# Patient Record
Sex: Female | Born: 1950
Health system: Southern US, Community
[De-identification: ages and names within clinical notes are randomized; demographics above are authoritative.]

## PROBLEM LIST (undated history)

## (undated) DIAGNOSIS — N200 Calculus of kidney: Secondary | ICD-10-CM

## (undated) HISTORY — PX: NO PAST SURGERIES: SHX2092

## (undated) HISTORY — DX: Calculus of kidney: N20.0

---

## 2013-01-25 ENCOUNTER — Ambulatory Visit: Payer: Self-pay | Admitting: Family Medicine

## 2013-04-06 ENCOUNTER — Ambulatory Visit: Payer: Self-pay | Admitting: Unknown Physician Specialty

## 2017-04-12 ENCOUNTER — Encounter: Payer: Self-pay | Admitting: Nurse Practitioner

## 2017-04-12 ENCOUNTER — Telehealth: Payer: Self-pay | Admitting: *Deleted

## 2017-04-12 ENCOUNTER — Ambulatory Visit (INDEPENDENT_AMBULATORY_CARE_PROVIDER_SITE_OTHER): Payer: Medicare Other | Admitting: Nurse Practitioner

## 2017-04-12 VITALS — BP 117/62 | HR 77 | Temp 98.3°F | Ht <= 58 in | Wt 121.8 lb

## 2017-04-12 DIAGNOSIS — K219 Gastro-esophageal reflux disease without esophagitis: Secondary | ICD-10-CM

## 2017-04-12 DIAGNOSIS — Z7689 Persons encountering health services in other specified circumstances: Secondary | ICD-10-CM

## 2017-04-12 DIAGNOSIS — Z87891 Personal history of nicotine dependence: Secondary | ICD-10-CM

## 2017-04-12 MED ORDER — OMEPRAZOLE 20 MG PO CPDR: 20.0000 mg | DELAYED_RELEASE_CAPSULE | Freq: Every day | ORAL | 5 refills | Status: DC

## 2017-04-12 NOTE — Progress Notes (Signed)
Subjective:    Patient ID: Martha Hudson, female    DOB: [birth year], 66 y.o.   MRN: [id number]  Martha Hudson is a 67 y.o. female presenting on 04/12/2017 for [place] (acid reflux )  Telephone [place] interpreter: [place] ID#: [id number]  HPI [place] Provider Pt last seen by PCP at [facility] in [place] about 4 years ago.  Obtain records from [facility].  Also saw cardiology and gastroenterology around that time.  Pt is accompanied today by her daughter, Claiborne Billings.  Heartburn/Acid Reflux 2-3 years years ago had trouble w/ stomach ulcer.  Took omeprazole for several months, then it healed and no ongoing medication.  A couple months ago, pt believes this has returned - Symptoms include: sudden onset of pain, usually at bedtime.  She occasionally has trouble breathing w/ these symptoms.  She will have a glass of water, rest and symptoms resolve about 30-40 minutes later.  This also occurs sometimes when she is at work or cleaning her house.  She has pain in epigastric region and is sometimes improved after removing her bra.  Had workup done in [place] about 4 years ago and stated she may have had a heart valve problem.  Since that time, she also had cardiology workup in [place] 3-4 years ago w/ normal stress test and echocardiogram per family report.     Smoker > 30 pack-year history Pt has smoked for > 30 years.  Cites used to pass cigarettes back and forth with her mother as a child, adolescent.  May have a greater than 15 pack year history despite only reporting 1/2 ppd x 30 years.  Pt is poor historian for time course of smoking history.  Pt has stopped smoking, but was smoking within the last 5 years.  Past Medical History:  Diagnosis Date  . Kidney stone    possible kidney stone.  Pt is poor historian, but knows she "had a stone" at about age 30- no recurrences.   Past Surgical History:  Procedure Laterality Date  . NO PAST SURGERIES     Social History   Social  History  . Marital status: Divorced    Spouse name: N/A  . Number of children: N/A  . Years of education: N/A   Occupational History  . Not on file.   Social History Main Topics  . Smoking status: Former Smoker    Packs/day: 0.50    Years: 35.00    Types: Cigarettes  . Smokeless tobacco: Never Used  . Alcohol use No  . Drug use: No  . Sexual activity: Not on file   Other Topics Concern  . Not on file   Social History Narrative   Pt is immigrant to [place] from [place] in 1999. She speaks [place] and only a minimal amount of broken [place]. Medical interpreter needed for all encounters.   Family History  Problem Relation Age of Onset  . Healthy Daughter    No current outpatient prescriptions on file prior to visit.   No current facility-administered medications on file prior to visit.     Review of Systems  Constitutional: Negative.   HENT: Negative.   Eyes: Negative.   Respiratory: Negative.   Gastrointestinal: Positive for abdominal pain. Negative for constipation and diarrhea.  Genitourinary: Negative.   Musculoskeletal: Negative.   Skin: Negative.   Allergic/Immunologic: Negative.   Neurological: Negative.   Hematological: Negative.   Psychiatric/Behavioral: Negative.    Per HPI unless specifically indicated above.  Objective:    BP 117/62 (BP Location: Right Arm, Patient Position: Sitting, Cuff Size: Normal)   Pulse 77   Temp 98.3 F (36.8 C) (Oral)   Ht 4' 8.75" (1.441 m)   Wt 121 lb 12.8 oz (55.2 kg)   BMI 26.59 kg/m   Wt Readings from Last 3 Encounters:  04/12/17 121 lb 12.8 oz (55.2 kg)    Physical Exam  General - overweight w/ central adiposity, well-appearing, NAD HEENT - Normocephalic, atraumatic Neck - supple, non-tender, no LAD, no thyromegaly, no carotid bruit Heart - RRR, normal S1/S2, no murmurs heard Lungs - Clear throughout all lobes, no wheezing, crackles, or rhonchi. Normal work of breathing. Abdomen - soft, NTND, no  masses, no hepatosplenomegaly, active bowel sounds Extremeties - non-tender, no edema, cap refill < 2 seconds, peripheral pulses intact +2 bilaterally Skin - warm, dry Neuro - awake, alert, oriented x3, normal gait Psych - Normal mood and affect, normal behavior      Assessment & Plan:   Problem List Items Addressed This Visit      Digestive   Gastroesophageal reflux disease    Chronic w/ acutely worsening symptoms.  Pt reports long history of heartburn symptoms that have worsened again over the last couple of months.  Symptoms may also be consistent w/ ulcer or gastritis if not responsive to current plan.  Heartburn and stomach pain worst when lying down.  Cannot exclude cardiac causes w/o repeat workup.  Plan: 1. Discussed need to make sure not cardiac in nature.  Pt declines cardiac workup today.  Had cardiology workup 3-4 years ago and was negative. 2. START omeprazole 20 mg once daily in am.  Discussed eating smaller meals, no late night meal then lying down for bed w/ in 2 hours of eating. 3. Follow up as needed and in 2 months for welcome to medicare.  Consider GI referral if needed in future.      Relevant Medications   omeprazole (PRILOSEC) 20 MG capsule     Other   Stopped smoking with greater than 30 pack year history - Primary    Pt stopped smoking within the last 5 years, but has history of > 30 year history of smoking.  Reports smoking > 3/4 ppd and used to share cigarettes w/ her mother when she was a child.  Pt requests screening for lung cancer.  Plan: 1. Recommend discussion w/ cancer center for possible low-dose CT scanning for lung cancer screening.   2. Follow up as needed.      Relevant Orders   Ambulatory Referral for Lung Cancer Scre    Other Visit Diagnoses    Encounter to establish care       Pt needing to establish new PCP.  Healthcare records available in [place] from [facility] for primary care.  Reviewed medical history with patient today.        Meds ordered this encounter  Medications  . DISCONTD: omeprazole (PRILOSEC) 20 MG capsule    Sig: Take 1 capsule by mouth.  Marland Kitchen omeprazole (PRILOSEC) 20 MG capsule    Sig: Take 1 capsule (20 mg total) by mouth daily.    Dispense:  30 capsule    Refill:  5    Order Specific Question:   Supervising Provider    Answer:   Olin Hauser [[id number]]      Follow up plan: Return in about 2 months (around 06/12/2017) for Welcome to Medicare.  Cassell Smiles, DNP, AGPCNP-BC Adult Gerontology Primary  Care Nurse Practitioner [facility] 04/15/2017, 11:52 AM

## 2017-04-12 NOTE — Patient Instructions (Addendum)
[place], Thank you for coming in to clinic today.  1. For your stomach pain: - START omeprazole 20 mg once daily. Continue for 2-4 weeks. If this helps, we know it is from heartburn.  If you do not have improvement of symptoms on medicine, we need to do another heart/Cardiology workup.   Please schedule a follow-up appointment with Cassell Smiles, AGNP. Return in about 2 months (around 06/12/2017) for Welcome to Medicare.  If you have any other questions or concerns, please feel free to call the clinic or send a message through [place]. You may also schedule an earlier appointment if necessary.  You will receive a survey after today's visit either digitally by e-mail or paper by [employer]. Your experiences and feedback matter to [place].  Please respond so we know how we are doing as we provide care for you.   Cassell Smiles, DNP, AGNP-BC Adult Gerontology Nurse Practitioner [place]

## 2017-04-12 NOTE — Telephone Encounter (Signed)
Received referral for low dose lung cancer screening CT scan. Attempted to leave message at phone number listed in EMR for patient to call me back to facilitate scheduling scan. However, this option is not available. 

## 2017-04-14 ENCOUNTER — Telehealth: Payer: Self-pay | Admitting: *Deleted

## 2017-04-14 DIAGNOSIS — Z87891 Personal history of nicotine dependence: Secondary | ICD-10-CM

## 2017-04-14 DIAGNOSIS — Z122 Encounter for screening for malignant neoplasm of respiratory organs: Secondary | ICD-10-CM

## 2017-04-14 NOTE — Telephone Encounter (Signed)
Received referral for initial lung cancer screening scan. Contacted patient's s/o and obtained smoking history,(current, 37.5 pack year) as well as answering questions related to screening process. Patient denies signs of lung cancer such as weight loss or hemoptysis. Patient denies comorbidity that would prevent curative treatment if lung cancer were found. Patient is scheduled for shared decision making visit and CT scan on 04/20/17.

## 2017-04-15 ENCOUNTER — Encounter: Payer: Self-pay | Admitting: Nurse Practitioner

## 2017-04-15 DIAGNOSIS — K219 Gastro-esophageal reflux disease without esophagitis: Secondary | ICD-10-CM | POA: Insufficient documentation

## 2017-04-15 DIAGNOSIS — Z87891 Personal history of nicotine dependence: Secondary | ICD-10-CM | POA: Insufficient documentation

## 2017-04-15 NOTE — Assessment & Plan Note (Signed)
Pt stopped smoking within the last 5 years, but has history of > 30 year history of smoking.  Reports smoking > 3/4 ppd and used to share cigarettes w/ her mother when she was a child.  Pt requests screening for lung cancer.  Plan: 1. Recommend discussion w/ cancer center for possible low-dose CT scanning for lung cancer screening.   2. Follow up as needed.

## 2017-04-15 NOTE — Assessment & Plan Note (Signed)
Chronic w/ acutely worsening symptoms.  Pt reports long history of heartburn symptoms that have worsened again over the last couple of months.  Symptoms may also be consistent w/ ulcer or gastritis if not responsive to current plan.  Heartburn and stomach pain worst when lying down.  Cannot exclude cardiac causes w/o repeat workup.  Plan: 1. Discussed need to make sure not cardiac in nature.  Pt declines cardiac workup today.  Had cardiology workup 3-4 years ago and was negative. 2. START omeprazole 20 mg once daily in am.  Discussed eating smaller meals, no late night meal then lying down for bed w/ in 2 hours of eating. 3. Follow up as needed and in 2 months for welcome to medicare.  Consider GI referral if needed in future.

## 2017-04-19 ENCOUNTER — Encounter: Payer: Self-pay | Admitting: Oncology

## 2017-04-20 ENCOUNTER — Ambulatory Visit
Admission: RE | Admit: 2017-04-20 | Discharge: 2017-04-20 | Disposition: A | Discharge status: Home / Self Care | Payer: Medicare Other | Source: Ambulatory Visit | Attending: Oncology | Admitting: Oncology

## 2017-04-20 ENCOUNTER — Inpatient Hospital Stay: Payer: Medicare Other | Attending: Oncology | Admitting: Oncology

## 2017-04-20 DIAGNOSIS — F17210 Nicotine dependence, cigarettes, uncomplicated: Secondary | ICD-10-CM | POA: Insufficient documentation

## 2017-04-20 DIAGNOSIS — J432 Centrilobular emphysema: Secondary | ICD-10-CM | POA: Diagnosis not present

## 2017-04-20 DIAGNOSIS — Z122 Encounter for screening for malignant neoplasm of respiratory organs: Secondary | ICD-10-CM

## 2017-04-20 DIAGNOSIS — I700 Atherosclerosis of aorta: Secondary | ICD-10-CM | POA: Insufficient documentation

## 2017-04-20 DIAGNOSIS — R911 Solitary pulmonary nodule: Secondary | ICD-10-CM | POA: Insufficient documentation

## 2017-04-20 DIAGNOSIS — E041 Nontoxic single thyroid nodule: Secondary | ICD-10-CM | POA: Diagnosis not present

## 2017-04-20 DIAGNOSIS — Z87891 Personal history of nicotine dependence: Secondary | ICD-10-CM

## 2017-04-20 NOTE — Progress Notes (Signed)
In accordance with CMS guidelines, patient has met eligibility criteria including age, absence of signs or symptoms of lung cancer.  Social History  Substance Use Topics  . Smoking status: Former Smoker    Packs/day: 0.75    Years: 50.00    Types: Cigarettes  . Smokeless tobacco: Never Used  . Alcohol use No     A shared decision-making session was conducted prior to the performance of CT scan. This includes one or more decision aids, includes benefits and harms of screening, follow-up diagnostic testing, over-diagnosis, false positive rate, and total radiation exposure.  Counseling on the importance of adherence to annual lung cancer LDCT screening, impact of co-morbidities, and ability or willingness to undergo diagnosis and treatment is imperative for compliance of the program.  Counseling on the importance of continued smoking cessation for former smokers; the importance of smoking cessation for current smokers, and information about tobacco cessation interventions have been given to patient including [place] and [address] quit [place] programs.  Written order for lung cancer screening with LDCT has been given to the patient and any and all questions have been answered to the best of my abilities.   Yearly follow up will be coordinated by Burgess Estelle, Thoracic Navigator.  Faythe Casa, NP 04/20/2017 2:49 PM

## 2017-04-23 ENCOUNTER — Telehealth: Payer: Self-pay | Admitting: *Deleted

## 2017-04-23 ENCOUNTER — Other Ambulatory Visit: Payer: Self-pay | Admitting: Nurse Practitioner

## 2017-04-23 DIAGNOSIS — E041 Nontoxic single thyroid nodule: Secondary | ICD-10-CM

## 2017-04-23 NOTE — Telephone Encounter (Signed)
Notified patient via daughter, Claiborne Billings, of LDCT lung cancer screening program results with recommendation for 12 month follow up imaging. Also notified of incidental findings noted below and is encouraged to discuss further with PCP (especially thyroid finding), who will receive a copy of this note and/or the CT report. Patient verbalizes understanding.    IMPRESSION: Lung-RADS 2, benign appearance or behavior. Continue annual screening with low-dose chest CT without contrast in 12 months.  Partially calcified 1.7 cm hypodense left thyroid lobe nodule, for which thyroid ultrasound correlation is warranted. This follows ACR consensus guidelines: Managing Incidental Thyroid Nodules Detected on Imaging: White Paper of the ACR Incidental Thyroid Findings Committee. J Am Coll Radiol 2015; 12:143-150.  Aortic Atherosclerosis (ICD10-I70.0) and Emphysema (ICD10-J43.9).

## 2017-04-23 NOTE — Telephone Encounter (Signed)
Will call back to speak to [facility] as she was unavailable. As long as pt and family agree, we will proceed with ultrasound of the thyroid gland for the thyroid nodule found on her CT scan.  This is only for additional evaluation.  Many thyroid nodules are benign, but some are cancerous.  Depending on that result, we may need to continue with a thyroid biopsy, but will wait for now.

## 2017-04-23 NOTE — Telephone Encounter (Signed)
Martha Hudson and Martha Hudson are agreeable to plan for thyroid ultrasound.

## 2017-04-23 NOTE — Addendum Note (Signed)
Addended by: Cassell Smiles R on: 04/23/2017 11:22 AM   Modules accepted: Orders

## 2017-05-03 ENCOUNTER — Ambulatory Visit
Admission: RE | Admit: 2017-05-03 | Discharge: 2017-05-03 | Disposition: A | Discharge status: Home / Self Care | Payer: Medicare Other | Source: Ambulatory Visit | Attending: Nurse Practitioner | Admitting: Nurse Practitioner

## 2017-05-03 DIAGNOSIS — E041 Nontoxic single thyroid nodule: Secondary | ICD-10-CM

## 2017-05-03 DIAGNOSIS — E042 Nontoxic multinodular goiter: Secondary | ICD-10-CM | POA: Diagnosis not present

## 2017-05-04 ENCOUNTER — Other Ambulatory Visit: Payer: Self-pay | Admitting: Nurse Practitioner

## 2017-05-04 DIAGNOSIS — E041 Nontoxic single thyroid nodule: Secondary | ICD-10-CM

## 2017-05-20 DIAGNOSIS — E042 Nontoxic multinodular goiter: Secondary | ICD-10-CM | POA: Diagnosis not present

## 2017-06-03 DIAGNOSIS — E042 Nontoxic multinodular goiter: Secondary | ICD-10-CM | POA: Diagnosis not present

## 2017-06-03 DIAGNOSIS — E041 Nontoxic single thyroid nodule: Secondary | ICD-10-CM | POA: Diagnosis not present

## 2017-06-07 LAB — POCT WET PREP (WET MOUNT)
Clue Cells Wet Prep Whiff POC: NEGATIVE
Trichomonas Wet Prep HPF POC: ABSENT

## 2017-06-08 ENCOUNTER — Encounter: Payer: Self-pay | Admitting: Nurse Practitioner

## 2017-06-08 ENCOUNTER — Other Ambulatory Visit: Payer: Self-pay

## 2017-06-08 ENCOUNTER — Ambulatory Visit (INDEPENDENT_AMBULATORY_CARE_PROVIDER_SITE_OTHER): Payer: Medicare Other | Admitting: Nurse Practitioner

## 2017-06-08 VITALS — BP 115/70 | HR 77 | Temp 98.3°F | Ht <= 58 in | Wt 118.6 lb

## 2017-06-08 DIAGNOSIS — Z1159 Encounter for screening for other viral diseases: Secondary | ICD-10-CM

## 2017-06-08 DIAGNOSIS — N898 Other specified noninflammatory disorders of vagina: Secondary | ICD-10-CM

## 2017-06-08 DIAGNOSIS — Z780 Asymptomatic menopausal state: Secondary | ICD-10-CM

## 2017-06-08 DIAGNOSIS — Z13220 Encounter for screening for lipoid disorders: Secondary | ICD-10-CM | POA: Diagnosis not present

## 2017-06-08 DIAGNOSIS — Z0000 Encounter for general adult medical examination without abnormal findings: Secondary | ICD-10-CM

## 2017-06-08 DIAGNOSIS — Z136 Encounter for screening for cardiovascular disorders: Secondary | ICD-10-CM

## 2017-06-08 DIAGNOSIS — Z13228 Encounter for screening for other metabolic disorders: Secondary | ICD-10-CM

## 2017-06-08 DIAGNOSIS — Z113 Encounter for screening for infections with a predominantly sexual mode of transmission: Secondary | ICD-10-CM | POA: Diagnosis not present

## 2017-06-08 DIAGNOSIS — Z114 Encounter for screening for human immunodeficiency virus [HIV]: Secondary | ICD-10-CM | POA: Diagnosis not present

## 2017-06-08 DIAGNOSIS — Z124 Encounter for screening for malignant neoplasm of cervix: Secondary | ICD-10-CM | POA: Diagnosis not present

## 2017-06-08 DIAGNOSIS — K219 Gastro-esophageal reflux disease without esophagitis: Secondary | ICD-10-CM | POA: Diagnosis not present

## 2017-06-08 DIAGNOSIS — Z1231 Encounter for screening mammogram for malignant neoplasm of breast: Secondary | ICD-10-CM

## 2017-06-08 DIAGNOSIS — Z131 Encounter for screening for diabetes mellitus: Secondary | ICD-10-CM | POA: Diagnosis not present

## 2017-06-08 DIAGNOSIS — Z23 Encounter for immunization: Secondary | ICD-10-CM | POA: Diagnosis not present

## 2017-06-08 DIAGNOSIS — Z130 Encounter for screening for diseases of the blood and blood-forming organs and certain disorders involving the immune mechanism: Secondary | ICD-10-CM

## 2017-06-08 DIAGNOSIS — Z1239 Encounter for other screening for malignant neoplasm of breast: Secondary | ICD-10-CM

## 2017-06-08 DIAGNOSIS — E782 Mixed hyperlipidemia: Secondary | ICD-10-CM

## 2017-06-08 NOTE — Patient Instructions (Addendum)
[place], Thank you for coming in to clinic today.  1. Your mammogram and bone density orders have been placed.  Call the Scheduling phone number at [phone] to schedule your mammogram at your convenience before your 67th birthday.  You can choose to go to either location listed below.  Let the scheduler know which location you prefer.   [facility] [address], [place] [address]                  [place], [place] [address]   Thank you for taking time to come for your Medicare Wellness Visit. I appreciate your ongoing commitment to your health goals. Please review the following plan we discussed and let me know if I can assist you in the future.    These are the goals we discussed: Goals    . Increase physical activity       This is a list of the screening recommended for you and due dates:  Health Maintenance  Topic Date Due  .  Hepatitis C: One time screening is recommended by Center for Disease Control  (CDC) for  adults born from [birth year] through 1964.   1950/09/11  . HIV Screening  08/27/1966  . Pap Smear  08/26/1972  . Mammogram  08/26/2001  . DEXA scan (bone density measurement)  08/26/2016  . Pneumonia vaccines (1 of 2 - PCV13) 08/26/2016  . Flu Shot  09/19/2017*  . Colon Cancer Screening  08/20/2020  . Tetanus Vaccine  01/17/2023  *Topic was postponed. The date shown is not the original due date.     Please schedule a follow-up appointment with Cassell Smiles, AGNP. Return in about 1 year (around 06/08/2018) for annual wellness with Tiffany.   If you have any other questions or concerns, please feel free to call the clinic or send a message through [place]. You may also schedule an earlier appointment if necessary.  You will receive a survey after today's visit either digitally by e-mail or paper by [employer]. Your experiences and feedback matter to [place].  Please respond so we know how we  are doing as we provide care for you.   Cassell Smiles, DNP, AGNP-BC Adult Gerontology Nurse Practitioner [place]

## 2017-06-08 NOTE — Progress Notes (Signed)
Subjective:    Martha Hudson is a 66 y.o. female who presents for Medicare Initial preventive examination.  Preventive Screening-Counseling & Management Tobacco Social History   Tobacco Use  Smoking Status Former Smoker  . Packs/day: 0.75  . Years: 50.00  . Pack years: 37.50  . Types: Cigarettes  Smokeless Tobacco Never Used     Problems Prior to Visit (verified) Patient Active Problem List   Diagnosis Date Noted  . Stopped smoking with greater than 30 pack year history 04/15/2017  . Gastroesophageal reflux disease 04/15/2017    Current Medications (verified) Current Outpatient Medications  Medication Sig Dispense Refill  . omeprazole (PRILOSEC) 20 MG capsule Take 1 capsule (20 mg total) by mouth daily. 30 capsule 5  . atorvastatin (LIPITOR) 20 MG tablet Take 1 tablet (20 mg total) by mouth daily. 90 tablet 3   No current facility-administered medications for this visit.      Allergies (verified) Patient has no known allergies.   Past Medical History:  Diagnosis Date  . Kidney stone    possible kidney stone.  Pt is poor historian, but knows she "had a stone" at about age 57- no recurrences.   Family History Family History  Problem Relation Age of Onset  . Healthy Daughter    Social History Social History   Tobacco Use  . Smoking status: Former Smoker    Packs/day: 0.75    Years: 50.00    Pack years: 37.50    Types: Cigarettes  . Smokeless tobacco: Never Used  Substance Use Topics  . Alcohol use: No   Are there smokers in your home (other than you)? No  Risk Factors Current exercise habits: The patient does not participate in regular exercise at present.  Dietary issues discussed: none  No hospitalizations in last 1 year.  Cardiac risk factors: sedentary lifestyle.  Depression Screen (Note: if answer to either of the following is "Yes", a more complete depression screening is indicated)   Over the past 2 weeks, have you felt down, depressed or  hopeless? No  Over the past 2 weeks, have you felt little interest or pleasure in doing things? No  Have you lost interest or pleasure in daily life? No  Do you often feel hopeless? No  Do you cry easily over simple problems? No  Activities of Daily Living In your present state of health, do you have any difficulty performing the following activities?:  Driving? No Managing money?  No Feeding yourself? No Getting from bed to chair? No Climbing a flight of stairs? No Preparing food and eating?: No Bathing or showering? No Getting dressed: No Getting to the toilet? No Using the toilet:No Moving around from place to place: No In the past year have you fallen or had a near fall?:No   Are you sexually active?  Yes  Do you have more than one partner?  No  Hearing Difficulties: No Do you often ask people to speak up or repeat themselves? No Do you experience ringing or noises in your ears? No Do you have difficulty understanding soft or whispered voices? No   Do you feel that you have a problem with memory? No  Do you often misplace items? No  Do you feel safe at home?  Yes  Cognitive Testing  Alert? Yes  Normal Appearance?Yes  Oriented to person? Yes  Place? Yes   Time? Yes  Displays appropriate judgment?Yes  Can read the correct time from a watch face?Yes 6CIT Screen 06/08/2017  What Year? 0 points  What month? 0 points  What time? 0 points  Count back from 20 0 points  Months in reverse 0 points  Repeat phrase 0 points  Total Score 0   Advanced Directives  Advanced Directives have been discussed with the patient? Yes  No current advanced directives in place.  List the Names of Other Physician/Practitioners you currently use: Optometry - [facility] [place]. Dr. Ocie Doyne, OD Dentist - none  Indicate any recent Medical Services you may have received from other than [facility] providers in the past year (date may be approximate).  Immunization History  Administered Date(s)  Administered  . Pneumococcal Conjugate-13 06/08/2017  . Tdap 01/16/2013    Screening Tests Health Maintenance  Topic Date Due  . MAMMOGRAM  08/26/2001  . DEXA SCAN  08/26/2016  . INFLUENZA VACCINE  09/19/2017 (Originally 01/20/2017)  . PNA vac Low Risk Adult (2 of 2 - PPSV23) 06/08/2018  . PAP SMEAR  06/08/2020  . COLONOSCOPY  08/20/2020  . TETANUS/TDAP  01/17/2023  . Hepatitis C Screening  Completed  . HIV Screening  Completed   History reviewed: allergies, current medications, past family history, past medical history, past social history, past surgical history and problem list  Review of Systems Review of Systems  Constitutional: Negative.   HENT: Negative.   Eyes: Negative.   Respiratory: Negative for cough.   Cardiovascular: Negative for chest pain, palpitations and claudication.  Gastrointestinal: Negative for abdominal pain and heartburn.  Skin: Negative.   Neurological: Negative for headaches.  Psychiatric/Behavioral: Negative.      Objective:    BP 115/70 (BP Location: Right Arm, Patient Position: Sitting, Cuff Size: Normal)   Pulse 77   Temp 98.3 F (36.8 C) (Oral)   Ht 4\' 7"  (1.397 m)   Wt 118 lb 9.6 oz (53.8 kg)   BMI 27.57 kg/m    Filed Weights   06/08/17 0826  Weight: 118 lb 9.6 oz (53.8 kg)    Physical Exam  General - healthy, well-appearing, NAD HEENT - Normocephalic, atraumatic, PERRL, EOMI, patent nares w/o congestion, oropharynx clear, MMM Neck - supple, non-tender, no LAD, no thyromegaly, no carotid bruit Heart - RRR, no murmurs heard Breast - Normal exam w/ symmetric breasts, no mass, no nipple discharge, no skin changes or tenderness.  Lungs - Clear throughout all lobes, no wheezing, crackles, or rhonchi. Normal work of breathing. Abdomen - soft, NTND, no masses, no hepatosplenomegaly, active bowel sounds GU - Normal external female genitalia without lesions or fusion. Vaginal canal with vaginitis present. Normal appearing cervix without  lesions or friability. Thick yellow-white discharge on exam. Bimanual exam without adnexal masses, enlarged uterus, or cervical motion tenderness Extremeties - non-tender, no edema, cap refill < 2 seconds, peripheral pulses intact +2 bilaterally Skin - warm, dry, no rashes Neuro - awake, alert, oriented x3, CN II-X intact, intact muscle strength 5/5 bilaterally, intact distal sensation to light touch, normal coordination, normal gait Psych - Normal mood and affect, normal behavior     Assessment:    Problem List Items Addressed This Visit      Digestive   Gastroesophageal reflux disease Pt has GERD and has had no assessment of anemia in last 1 year. Check CBC today.   Relevant Orders   CBC with Differential/Platelet (Completed)    Other Visit Diagnoses    Welcome to Medicare preventive visit    -  Primary Reviewed patient's Family Medical History Reviewed and updated list of patient's medical providers Assessment  of cognitive impairment was done Assessed patient's functional ability Established a written schedule for health screening Effie Completed and Reviewed  Exercise Activities and Dietary recommendations Goals    . Increase physical activity       Immunization History  Administered Date(s) Administered  . Pneumococcal Conjugate-13 06/08/2017  . Tdap 01/16/2013    Health Maintenance  Topic Date Due  . MAMMOGRAM  08/26/2001  . DEXA SCAN  08/26/2016  . INFLUENZA VACCINE  09/19/2017 (Originally 01/20/2017)  . PNA vac Low Risk Adult (2 of 2 - PPSV23) 06/08/2018  . PAP SMEAR  06/08/2020  . COLONOSCOPY  08/20/2020  . TETANUS/TDAP  01/17/2023  . Hepatitis C Screening  Completed  . HIV Screening  Completed    Discussed health benefits of physical activity, and encouraged her to engage in regular exercise appropriate for her age and condition.   Meds ordered this encounter  Medications  . atorvastatin (LIPITOR) 20 MG tablet    Sig: Take 1  tablet (20 mg total) by mouth daily.    Dispense:  90 tablet    Refill:  3    Order Specific Question:   Supervising Provider    Answer:   Olin Hauser [[id number]]    Current Outpatient Medications:  .  omeprazole (PRILOSEC) 20 MG capsule, Take 1 capsule (20 mg total) by mouth daily., Disp: 30 capsule, Rfl: 5 .  atorvastatin (LIPITOR) 20 MG tablet, Take 1 tablet (20 mg total) by mouth daily., Disp: 90 tablet, Rfl: 3 There are no discontinued medications.  Next Medicare Wellness Visit in 12+ months   Relevant Orders   EKG 12-Lead   CBC with Differential/Platelet (Completed)   Comprehensive metabolic panel (Completed)   Lipid panel (Completed)   Screening for diabetes mellitus     Pt has had no screening for DM.  Check CMP for glucose.    Screening for metabolic disorder     Pt without screening for DM or other metabolic disorders.  Check CMP.   Relevant Orders   Comprehensive metabolic panel (Completed)   Need for hepatitis C screening test     Pt w/o prior Hep C screening.  Lab today.   Relevant Orders   Hepatitis C antibody   Encounter for screening for HIV     Pt w/o prior HIV screening.  Lab today.   Relevant Orders   HIV antibody   Asymptomatic postmenopausal estrogen deficiency     Pt postmenopausal w/o history of prior DEXA scan.    Plan: 1. Obtain DG bone density.     Relevant Orders   DG Bone Density   Breast cancer screening     Pt last mammogram more than 2 years ago.  Result normal without abnormal findings in past.  Plan: 1. Screening mammogram order placed.  Pt will call to schedule appointment.  Information given.    Relevant Orders   MM DIGITAL SCREENING BILATERAL   Need for vaccination against Streptococcus pneumoniae using pneumococcal conjugate vaccine 13     Pt agrees to begin pneumonia vaccines.  Administer Prevnar 13 today.   Relevant Orders   Pneumococcal conjugate vaccine 13-valent IM (Completed)   Cervical cancer screening     Pt age  2 without recent PAP smear.  Test for GC/Chlamydia w/ presence of yellow/white thick discharge and vaginitis.  Negative   Relevant Orders   Pap IG and Chlamydia/Gonococcus, NAA (Completed)   Screen for STD (sexually transmitted disease)     Purulent vaginal  discharge  Pt age 66 without recent PAP smear.  Test for GC/Chlamydia w/ presence of yellow/white thick discharge and vaginitis.   - Negative candida, BV, and Trichomoniasis   Relevant Orders   Pap IG and Chlamydia/Gonococcus, NAA (Completed)   POCT Wet Prep [facility])   Encounter for lipid screening for cardiovascular disease     Pt has cardiovascular risk for sedentary lifestyle.  Assess lipid panel.   Relevant Orders   Lipid panel (Completed)   Mixed hyperlipidemia     Elevated lipid found on screening.  START atorvastatin.   Relevant Medications   atorvastatin (LIPITOR) 20 MG tablet      During the course of the visit the patient was educated and counseled about appropriate screening and preventive services including:    Pneumococcal vaccine   Influenza vaccine  Hepatitis B vaccine  Td vaccine  Screening electrocardiogram  Screening mammography  Screening Pap smear and pelvic exam   Bone densitometry screening  Colorectal cancer screening  Diabetes screening  Glaucoma screening  Advanced directives: has NO advanced directive  - add't info requested. Referral to SW: not applicable  Diet review for nutrition referral? no Patient Instructions (the written plan) was given to the patient.  Medicare Attestation I have personally reviewed: The patient's medical and social history Their use of alcohol, tobacco or illicit drugs Their current medications and supplements The patient's functional ability including ADLs,fall risks, home safety risks, cognitive, and hearing and visual impairment Diet and physical activities Evidence for depression or mood disorders  The patient's weight, height, BMI, and visual  acuity have been recorded in the chart.  I have made referrals, counseling, and provided education to the patient based on review of the above and I have provided the patient with a written personalized care plan for preventive services.     Cassell Smiles, DNP, AGPCNP-BC Adult Gerontology Primary Care Nurse Practitioner [facility] 06/21/2017, 8:08 AM

## 2017-06-09 ENCOUNTER — Telehealth: Payer: Self-pay

## 2017-06-09 LAB — COMPREHENSIVE METABOLIC PANEL
AG Ratio: 1.8 (calc) (ref 1.0–2.5)
ALT: 17 U/L (ref 6–29)
AST: 15 U/L (ref 10–35)
Albumin: 4.6 g/dL (ref 3.6–5.1)
Alkaline phosphatase (APISO): 74 U/L (ref 33–130)
BUN: 19 mg/dL (ref 7–25)
CO2: 27 mmol/L (ref 20–32)
Calcium: 9.5 mg/dL (ref 8.6–10.4)
Chloride: 107 mmol/L (ref 98–110)
Creat: 0.85 mg/dL (ref 0.50–0.99)
Globulin: 2.6 g/dL (calc) (ref 1.9–3.7)
Glucose, Bld: 105 mg/dL (ref 65–139)
Potassium: 4.2 mmol/L (ref 3.5–5.3)
Sodium: 144 mmol/L (ref 135–146)
Total Bilirubin: 0.4 mg/dL (ref 0.2–1.2)
Total Protein: 7.2 g/dL (ref 6.1–8.1)

## 2017-06-09 LAB — CBC WITH DIFFERENTIAL/PLATELET
Basophils Absolute: 50 cells/uL (ref 0–200)
Basophils Relative: 0.8 %
Eosinophils Absolute: 139 cells/uL (ref 15–500)
Eosinophils Relative: 2.2 %
HCT: 41.9 % (ref 35.0–45.0)
Hemoglobin: 13.9 g/dL (ref 11.7–15.5)
Lymphs Abs: 2426 cells/uL (ref 850–3900)
MCH: 30.4 pg (ref 27.0–33.0)
MCHC: 33.2 g/dL (ref 32.0–36.0)
MCV: 91.7 fL (ref 80.0–100.0)
MPV: 10.9 fL (ref 7.5–12.5)
Monocytes Relative: 6.8 %
Neutro Abs: 3257 cells/uL (ref 1500–7800)
Neutrophils Relative %: 51.7 %
Platelets: 200 10*3/uL (ref 140–400)
RBC: 4.57 10*6/uL (ref 3.80–5.10)
RDW: 11.9 % (ref 11.0–15.0)
Total Lymphocyte: 38.5 %
WBC mixed population: 428 cells/uL (ref 200–950)
WBC: 6.3 10*3/uL (ref 3.8–10.8)

## 2017-06-09 LAB — LIPID PANEL
Cholesterol: 255 mg/dL — ABNORMAL HIGH (ref <200)
HDL: 42 mg/dL — ABNORMAL LOW (ref >50)
LDL Cholesterol (Calc): 171 mg/dL (calc) — ABNORMAL HIGH
Non-HDL Cholesterol (Calc): 213 mg/dL (calc) — ABNORMAL HIGH (ref <130)
Total CHOL/HDL Ratio: 6.1 (calc) — ABNORMAL HIGH (ref <5.0)
Triglycerides: 247 mg/dL — ABNORMAL HIGH (ref <150)

## 2017-06-09 LAB — HEPATITIS C ANTIBODY
Hepatitis C Ab: NONREACTIVE
SIGNAL TO CUT-OFF: 0.02 (ref <1.00)

## 2017-06-09 LAB — HIV ANTIBODY (ROUTINE TESTING W REFLEX): HIV 1&2 Ab, 4th Generation: NONREACTIVE

## 2017-06-09 MED ORDER — ATORVASTATIN CALCIUM 20 MG PO TABS: 20.0000 mg | ORAL_TABLET | Freq: Every day | ORAL | 3 refills | Status: DC

## 2017-06-09 NOTE — Telephone Encounter (Signed)
-----   Message from Mikey College, NP sent at 06/09/2017  9:12 AM EST ----- HIV negative as well.

## 2017-06-09 NOTE — Telephone Encounter (Signed)
Attempted to contact the pt daughter Claiborne Billings, left message on vm to return my call.

## 2017-06-11 LAB — PAP IG AND CT-NG NAA
C. trachomatis RNA, TMA: NOT DETECTED
N. gonorrhoeae RNA, TMA: NOT DETECTED

## 2017-07-06 ENCOUNTER — Ambulatory Visit
Admission: RE | Admit: 2017-07-06 | Discharge: 2017-07-06 | Disposition: A | Discharge status: Home / Self Care | Payer: Medicare Other | Source: Ambulatory Visit | Attending: Nurse Practitioner | Admitting: Nurse Practitioner

## 2017-07-06 DIAGNOSIS — M85852 Other specified disorders of bone density and structure, left thigh: Secondary | ICD-10-CM | POA: Insufficient documentation

## 2017-07-06 DIAGNOSIS — Z13820 Encounter for screening for osteoporosis: Secondary | ICD-10-CM | POA: Diagnosis not present

## 2017-07-06 DIAGNOSIS — Z780 Asymptomatic menopausal state: Secondary | ICD-10-CM | POA: Diagnosis not present

## 2017-07-06 DIAGNOSIS — Z1231 Encounter for screening mammogram for malignant neoplasm of breast: Secondary | ICD-10-CM | POA: Insufficient documentation

## 2017-07-06 DIAGNOSIS — Z1239 Encounter for other screening for malignant neoplasm of breast: Secondary | ICD-10-CM

## 2017-07-06 DIAGNOSIS — F17200 Nicotine dependence, unspecified, uncomplicated: Secondary | ICD-10-CM | POA: Diagnosis not present

## 2017-08-26 DIAGNOSIS — E042 Nontoxic multinodular goiter: Secondary | ICD-10-CM | POA: Diagnosis not present

## 2017-09-15 ENCOUNTER — Other Ambulatory Visit: Payer: Self-pay | Admitting: Nurse Practitioner

## 2017-10-06 DIAGNOSIS — Z79899 Other long term (current) drug therapy: Secondary | ICD-10-CM | POA: Diagnosis not present

## 2017-10-06 DIAGNOSIS — E041 Nontoxic single thyroid nodule: Secondary | ICD-10-CM | POA: Diagnosis not present

## 2017-10-06 DIAGNOSIS — E7800 Pure hypercholesterolemia, unspecified: Secondary | ICD-10-CM | POA: Diagnosis not present

## 2017-10-06 DIAGNOSIS — F17200 Nicotine dependence, unspecified, uncomplicated: Secondary | ICD-10-CM | POA: Diagnosis not present

## 2017-10-09 ENCOUNTER — Other Ambulatory Visit: Payer: Self-pay | Admitting: Nurse Practitioner

## 2017-10-22 DIAGNOSIS — C73 Malignant neoplasm of thyroid gland: Secondary | ICD-10-CM | POA: Diagnosis not present

## 2017-10-22 DIAGNOSIS — E7800 Pure hypercholesterolemia, unspecified: Secondary | ICD-10-CM | POA: Diagnosis not present

## 2017-10-22 DIAGNOSIS — E041 Nontoxic single thyroid nodule: Secondary | ICD-10-CM | POA: Diagnosis not present

## 2017-10-22 DIAGNOSIS — F17210 Nicotine dependence, cigarettes, uncomplicated: Secondary | ICD-10-CM | POA: Diagnosis not present

## 2017-10-22 DIAGNOSIS — K219 Gastro-esophageal reflux disease without esophagitis: Secondary | ICD-10-CM | POA: Diagnosis not present

## 2017-11-09 DIAGNOSIS — C73 Malignant neoplasm of thyroid gland: Secondary | ICD-10-CM | POA: Diagnosis not present

## 2017-11-17 DIAGNOSIS — R7989 Other specified abnormal findings of blood chemistry: Secondary | ICD-10-CM | POA: Diagnosis not present

## 2017-11-17 DIAGNOSIS — C73 Malignant neoplasm of thyroid gland: Secondary | ICD-10-CM | POA: Diagnosis not present

## 2017-11-17 DIAGNOSIS — E0590 Thyrotoxicosis, unspecified without thyrotoxic crisis or storm: Secondary | ICD-10-CM | POA: Diagnosis not present

## 2018-03-02 DIAGNOSIS — C73 Malignant neoplasm of thyroid gland: Secondary | ICD-10-CM | POA: Diagnosis not present

## 2018-03-02 DIAGNOSIS — E042 Nontoxic multinodular goiter: Secondary | ICD-10-CM | POA: Diagnosis not present

## 2018-04-04 ENCOUNTER — Telehealth: Payer: Self-pay | Admitting: *Deleted

## 2018-04-04 NOTE — Telephone Encounter (Signed)
Left message with daughter.  Attempted to contact patient r/t LDCT Screening follow up due at this time.  No answer received, message left for patient to call [phone] to schedule appointment.

## 2018-04-13 ENCOUNTER — Telehealth: Payer: Self-pay | Admitting: *Deleted

## 2018-04-13 NOTE — Telephone Encounter (Signed)
Left message with daughter who will talk with patient and call back to schedule lung screening scan if she is interested.

## 2018-04-14 ENCOUNTER — Encounter: Payer: Self-pay | Admitting: *Deleted

## 2018-05-05 DIAGNOSIS — C73 Malignant neoplasm of thyroid gland: Secondary | ICD-10-CM | POA: Diagnosis not present

## 2018-06-26 ENCOUNTER — Other Ambulatory Visit: Payer: Self-pay | Admitting: Nurse Practitioner

## 2018-06-26 DIAGNOSIS — K219 Gastro-esophageal reflux disease without esophagitis: Secondary | ICD-10-CM

## 2018-07-28 ENCOUNTER — Other Ambulatory Visit: Payer: Self-pay | Admitting: Nurse Practitioner

## 2018-07-28 DIAGNOSIS — K219 Gastro-esophageal reflux disease without esophagitis: Secondary | ICD-10-CM

## 2018-08-09 ENCOUNTER — Other Ambulatory Visit: Payer: Self-pay

## 2018-09-07 DIAGNOSIS — E042 Nontoxic multinodular goiter: Secondary | ICD-10-CM | POA: Diagnosis not present

## 2018-09-07 DIAGNOSIS — C73 Malignant neoplasm of thyroid gland: Secondary | ICD-10-CM | POA: Diagnosis not present

## 2018-10-05 ENCOUNTER — Encounter: Payer: Medicare Other | Admitting: Nurse Practitioner

## 2019-01-23 ENCOUNTER — Other Ambulatory Visit: Payer: Self-pay

## 2019-02-03 ENCOUNTER — Ambulatory Visit (INDEPENDENT_AMBULATORY_CARE_PROVIDER_SITE_OTHER): Payer: Medicare Other | Admitting: Nurse Practitioner

## 2019-02-03 ENCOUNTER — Other Ambulatory Visit: Payer: Self-pay

## 2019-02-03 ENCOUNTER — Encounter: Payer: Self-pay | Admitting: Nurse Practitioner

## 2019-02-03 DIAGNOSIS — K219 Gastro-esophageal reflux disease without esophagitis: Secondary | ICD-10-CM | POA: Diagnosis not present

## 2019-02-03 DIAGNOSIS — E782 Mixed hyperlipidemia: Secondary | ICD-10-CM | POA: Diagnosis not present

## 2019-02-03 MED ORDER — OMEPRAZOLE 20 MG PO CPDR: 20.0000 mg | DELAYED_RELEASE_CAPSULE | Freq: Every day | ORAL | 4 refills | Status: DC

## 2019-02-03 NOTE — Patient Instructions (Signed)
Phng ng?a cholesterol cao Preventing High Cholesterol Cholesterol l m?t ch?t gi?ng ch?t bo d?ng sp m c? th? c?n v?i l??ng nh?Eda Paschal c?a quy? vi? ta?o ra t?t c? cholesterol m c? th? c?n. B? cholesterol cao (cholesterol mu cao) lm t?ng nguy c? b? b?nh tim v ??t qu?Marland Kitchen Cholesterol d? (d? th?a) trong th?c ph?m qu v? ?n, ch?ng h?n nh? ch?t bo ??ng v?t (ch?t bo bo ha) t? th?t v m?t s? s?n ph?m s?a. Th??ng c th? phng ng?a cholesterol cao b?ng cch thay ??i ch? ?? ?n v l?i s?ng. N?u qu v? ? c cholesterol cao, qu v? c th? ki?m sot n b?ng cch thay ??i ch? ?? ?n v l?i s?ng, c?ng nh? l dng thu?c. C th? th?c hi?n nh?ng thay ??i dinh d??ng no khc?  ?n t ch?t bo bo ha h?n. Cc th?c ph?m c ch?a ch?t bo bo ha bao g?m th?t ?? v m?t s? s?n ph?m s?a.  Trnh th?t ch? bi?n s?n, nh? l th?t xng khi v th?t ?n tr?a.  Hessie Diener cc ch?t bo chuy?n ha, chng ???c tm th?y trong b? th?c v?t v m?t s? lo?i bnh n??ng.  Hessie Diener cc th?c ph?m v ?? u?ng c thm ???ng.  ?n nhi?u tri cy, rau c? v ng? c?c nguyn cm h?n.  Ch?n cc ngu?n protein t?t cho s?c kh?e, ch?ng h?n nh? c, th?t gia c?m v qu? h?ch.  Ch?n cc ngu?n ch?t bo t?t cho s?c kh?e, ch?ng h?n nh?: ? Qu? h?ch. ? D?u th?c v?t, ??c bi?t l d?u  liu. ? C c ch?a ch?t bo t?t cho s?c kh?e (axit bo omega-3), ch?ng h?n nh? c thu ho?c c h?i. C th? th?c hi?n nh?ng thay ??i no v? l?i s?ng?   Gi?m cn n?u qu v? th?a cn. Gi?m 5-10 lb (2,3-4,5 kg) c th? gip phng ng?a ho?c ki?m sot cholesterol cao v gi?m nguy c? b? ti?u ???ng v huy?t p cao. Hy yu c?u chuyn gia ch?m [place] s?c kh?e gip qu v? l?p m?t k? ho?ch t?p th? d?c v ch? ?? ?n ?? gi?m cn an ton.  T?p th? d?c ??y ??. Th??c hi?n i?t nh?t 150 phu?t t?p th? du?c v?i c???ng ?? trung bi?nh m?i tu?n. ? Qu v? c th? lm ?i?u ny trong cc phin t?p ng?n, vi l?n m?i ngy ho?c c th? t?p nh?ng phin t?p di h?n m?t vi l?n m?i tu?n. V d?: qu v? c th? ?i b?  nhanh ho?c ??p xe trong 10 pht, 3 l?n m?i ngy, trong 5 ngy m?i tu?n.  Khng ht thu?c. N?u qu v? c?n gip ?? ?? cai thu?c, hy h?i chuyn gia ch?m [place] s?c kh?e.  Gi?i h?n l??ng r??u qu v? tiu th?. N?u qu v? u?ng r??u, hy gi?i h?n l??ng r??u qu v? u?ng ? m?c khng qu 1 ly m?i ngy v?i ph? n? khng mang thai v 2 ly m?i ngy v?i nam gi?i. M?t ly t??ng ???ng v?i 12 ao-x? bia, 5 ao-x? r??u vang, ho?c 1 ao-x? r??u m?nh. T?i sao nh?ng thay ??i ny l?i quan tr?ng?  N?u qu v? c cholesterol cao, cc c?n ?ng (m?ng bm) c th? tch t? trn thnh c?a cc m?ch mu. Cc m?ng bm khi?n ??ng m?ch h?p v c?ng h?n, ?i?u ny c th? lm h?n ch? ho?c c?n tr? dng mu l?u thng v khi?n cc c?c mu ?ng hnh thnh. ?i?u ny lm t?ng m?nh nguy c? b? nh?i mu c? tim  v ??t qu?Marland Kitchen Thay ??i ch? ?? ?n v l?i s?ng c th? gi?m nguy c? qu v? b? nh?ng tnh tr?ng ?e d?a tnh m?ng ny. Ti c th? lm g ?? gi?m nguy c??  Qu?n l cc y?u t? nguy c? b? cholesterol cao c?a qu v?. Hy trao ??i v?i chuyn gia ch?m [place] s?c kh?e v? t?t c? cc y?u t? nguy c? v cch gi?m b?t nguy c? c?a qu v?.  Qu?n l cc tnh tr?ng khc m qu v? c, ch?ng h?n nh? b?nh ti?u ???ng ho?c cao huy?t p (t?ng huy?t p).  ??nh k? ki?m tra l??ng cholesterol c?a qu v?.  Tun th? t?t c? cc l?n khm theo di theo ch? d?n c?a chuyn gia ch?m [place] s?c kh?e. ?i?u ny c vai tr quan tr?ng. Tnh tr?ng ny ???c ?i?u tr? nh? th? no? Ngoi thay ??i ch? ?? ?n v l?i s?ng, chuyn gia ch?m [place] s?c kh?e c th? khuy?n ngh? dng cc thu?c gip h? cholesterol, ch?ng h?n nh? thu?c lm gi?m l??ng cholesterol ???c t?o ra ? gan qu v?. Qu v? c th? c?n dng thu?c n?u:  Thay ??i ch? ?? ?n v l?i s?ng khng ?? lm gi?m cholesterol.  Qu v? c cholesterol cao v cc y?u t? nguy c? khc ??i v?i b?nh tim ho?c ??t qu?Marland Kitchen Ch? s? d?ng thu?c khng k ??n v thu?c k ??n theo ch? d?n c?a chuyn gia ch?m [place] s?c kh?e. N?i ?? tm thm thng tin  Hi?p h?i Tim m?ch Hoa  K?: [email or web address]  Vi?n Tim, Ph?i v Mu Qu?c gia: [email or web address] Tm t?t  Cholesterol cao lm t?ng nguy c? b? b?nh tim v ??t qu? c?a qu v?. B?ng cch duy tr cholesterol ? m?c th?p, qu v? c th? gi?m nguy c? b? nh?ng tnh tr?ng ny.  Thay ??i ch? ?? ?n v l?i s?ng l nh?ng b??c quan tr?ng nh?t trong vi?c phng ng?a cholesterol cao.  Hy lm vi?c v?i chuyn gia ch?m [place] s?c kh?e ?? qu?n l cc y?u t? nguy c? c?a qu v? v lm xt nghi?m mu th??ng xuyn. Thng tin ny khng nh?m m?c ?ch thay th? cho l?i khuyn m chuyn gia ch?m [place] s?c kh?e ni v?i qu v?. Hy b?o ??m qu v? ph?i th?o lu?n b?t k? v?n ?? g m qu v? c v?i chuyn gia ch?m [place] s?c kh?e c?a qu v?. Document Released: 09/24/2016 Document Revised: 09/24/2016 Elsevier Patient Education  [employer].

## 2019-02-03 NOTE — Progress Notes (Signed)
Telemedicine Encounter: Disclosed to patient at start of encounter that we will provide appropriate telemedicine services.  Patient consents to be treated via phone prior to discussion. - Patient is at her home and is accessed via telephone. - Services are provided by Cassell Smiles from [facility].  Subjective:    Patient ID: Martha Hudson, female    DOB: 05-16-1951, 68 y.o.   MRN: [id number] Martha Hudson is a 68 y.o. female presenting on 02/03/2019 for Gastroesophageal Reflux (Interpreter Maudie Mercury [phone] , need refills on medications )  HPI Second vietnamese interpreter was called after rooming.    InterpreterCephus Shelling      ID: [id number]  GERD Patient denies any symptoms of reflux at this time.  Patient is taking omeprazole 20 mg once daily.  Patient takes after breakfast daily.  She reports no n/v, coffee ground emesis, dark/black/tarry stool, BRBPR, or other GI bleeding.  Cholesterol Patient has NOT been taking atorvastatin 20 mg once daily.  She never started this after it was prescribed. She has not had any ASCVD events or cardiovascular symptoms.  Tension headache Patient has had headache occasionally, self-limited and resolves.  Social History   Tobacco Use  . Smoking status: Former Smoker    Packs/day: 0.75    Years: 50.00    Pack years: 37.50    Types: Cigarettes  . Smokeless tobacco: Never Used  Substance Use Topics  . Alcohol use: No  . Drug use: No   Review of Systems Per HPI unless specifically indicated above.     Objective:    There were no vitals taken for this visit.  Wt Readings from Last 3 Encounters:  06/08/17 118 lb 9.6 oz (53.8 kg)  04/20/17 120 lb (54.4 kg)  04/12/17 121 lb 12.8 oz (55.2 kg)    Physical Exam Patient remotely monitored.  Verbal communication appropriate.  Cognition normal.   Results for orders placed or performed in visit on 06/08/17  CBC with Differential/Platelet  Result Value Ref Range   WBC 6.3 3.8 - 10.8 Thousand/uL   RBC  4.57 3.80 - 5.10 Million/uL   Hemoglobin 13.9 11.7 - 15.5 g/dL   HCT 41.9 35.0 - 45.0 %   MCV 91.7 80.0 - 100.0 fL   MCH 30.4 27.0 - 33.0 pg   MCHC 33.2 32.0 - 36.0 g/dL   RDW 11.9 11.0 - 15.0 %   Platelets 200 140 - 400 Thousand/uL   MPV 10.9 7.5 - 12.5 fL   Neutro Abs 3,257 1,500 - 7,800 cells/uL   Lymphs Abs 2,426 850 - 3,900 cells/uL   WBC mixed population 428 200 - 950 cells/uL   Eosinophils Absolute 139 15 - 500 cells/uL   Basophils Absolute 50 0 - 200 cells/uL   Neutrophils Relative % 51.7 %   Total Lymphocyte 38.5 %   Monocytes Relative 6.8 %   Eosinophils Relative 2.2 %   Basophils Relative 0.8 %  Comprehensive metabolic panel  Result Value Ref Range   Glucose, Bld 105 65 - 139 mg/dL   BUN 19 7 - 25 mg/dL   Creat 0.85 0.50 - 0.99 mg/dL   BUN/Creatinine Ratio NOT APPLICABLE 6 - 22 (calc)   Sodium 144 135 - 146 mmol/L   Potassium 4.2 3.5 - 5.3 mmol/L   Chloride 107 98 - 110 mmol/L   CO2 27 20 - 32 mmol/L   Calcium 9.5 8.6 - 10.4 mg/dL   Total Protein 7.2 6.1 - 8.1 g/dL   Albumin  4.6 3.6 - 5.1 g/dL   Globulin 2.6 1.9 - 3.7 g/dL (calc)   AG Ratio 1.8 1.0 - 2.5 (calc)   Total Bilirubin 0.4 0.2 - 1.2 mg/dL   Alkaline phosphatase (APISO) 74 33 - 130 U/L   AST 15 10 - 35 U/L   ALT 17 6 - 29 U/L  Lipid panel  Result Value Ref Range   Cholesterol 255 (H) <200 mg/dL   HDL 42 (L) >50 mg/dL   Triglycerides 247 (H) <150 mg/dL   LDL Cholesterol (Calc) 171 (H) mg/dL (calc)   Total CHOL/HDL Ratio 6.1 (H) <5.0 (calc)   Non-HDL Cholesterol (Calc) 213 (H) <130 mg/dL (calc)  Hepatitis C antibody  Result Value Ref Range   Hepatitis C Ab NON-REACTIVE NON-REACTI   SIGNAL TO CUT-OFF 0.02 <1.00  HIV antibody  Result Value Ref Range   HIV 1&2 Ab, 4th Generation NON-REACTIVE NON-REACTI  POCT Wet Prep ([place])  Result Value Ref Range   Source Wet Prep POC vagina    WBC, Wet Prep HPF POC     Bacteria Wet Prep HPF POC Few Few   BACTERIA WET PREP MORPHOLOGY POC     Clue Cells  Wet Prep HPF POC None None   Clue Cells Wet Prep Whiff POC Negative Whiff    Yeast Wet Prep HPF POC None    KOH Wet Prep POC     Trichomonas Wet Prep HPF POC Absent Absent  Pap IG and Chlamydia/Gonococcus, NAA  Result Value Ref Range   Clinical Information:     LMP:     PREV. PAP:     PREV. BX:     HPV DNA Probe-Source     STATEMENT OF ADEQUACY:     INTERPRETATION/RESULT:     Comment:     CYTOTECHNOLOGIST:     REVIEW CYTOTECHNOLOGIST:     C. trachomatis RNA, TMA NOT DETECTED NOT DETECT   N. gonorrhoeae RNA, TMA NOT DETECTED NOT DETECT      Assessment & Plan:   Problem List Items Addressed This Visit      Digestive   Gastroesophageal reflux disease Currently well controlled on omeprazole 20 mg once daily.  No signs and symptoms of bleeding or other complication.  Plan: 1. Continue omeprazole 20 mg once daily. Side effects discussed. Pt wants to continue med. 2. Avoid diet triggers. Reviewed need to seek care if globus sensation, difficulty swallowing, s/sx of GI bleed. 3. Follow up as needed and in 1 year.    Relevant Medications   omeprazole (PRILOSEC) 20 MG capsule   Other Relevant Orders   COMPLETE METABOLIC PANEL WITH GFR    Other Visit Diagnoses    Mixed hyperlipidemia     Likely unchanged as patient has not started statin.  Previously uncontrolled.  Encouraged patient to resume/start statin.  Repeat labs in 6 weeks after starting med.  Follow-up 6 months in clinic to further discuss.   Relevant Orders   COMPLETE METABOLIC PANEL WITH GFR   Lipid panel      Meds ordered this encounter  Medications  . omeprazole (PRILOSEC) 20 MG capsule    Sig: Take 1 capsule (20 mg total) by mouth daily.    Dispense:  90 capsule    Refill:  4    Order Specific Question:   Supervising Provider    Answer:   Olin Hauser [[id number]]    - Time spent in direct consultation with patient via telemedicine about above concerns: 16 minutes  Follow up plan: Return in about 6  months (around 08/06/2019) for cholesterol.  Cassell Smiles, DNP, AGPCNP-BC Adult Gerontology Primary Care Nurse Practitioner [facility] 02/03/2019, 8:44 AM

## 2019-02-09 ENCOUNTER — Encounter: Payer: Self-pay | Admitting: Nurse Practitioner

## 2019-03-07 ENCOUNTER — Encounter: Payer: Self-pay | Admitting: Nurse Practitioner

## 2019-03-07 ENCOUNTER — Ambulatory Visit (INDEPENDENT_AMBULATORY_CARE_PROVIDER_SITE_OTHER): Payer: Medicare Other | Admitting: Nurse Practitioner

## 2019-03-07 ENCOUNTER — Other Ambulatory Visit: Payer: Self-pay

## 2019-03-07 VITALS — BP 110/66 | HR 88 | Ht <= 58 in | Wt 128.0 lb

## 2019-03-07 DIAGNOSIS — M545 Low back pain, unspecified: Secondary | ICD-10-CM

## 2019-03-07 MED ORDER — BACLOFEN 10 MG PO TABS: 10.0000 mg | ORAL_TABLET | Freq: Three times a day (TID) | ORAL | 0 refills | Status: DC | PRN

## 2019-03-07 NOTE — Patient Instructions (Addendum)
Martha Hudson,   Thank you for coming in to clinic today.  1. You have a low back muscle strain. - Start taking Tylenol extra strength 1 to 2 tablets every 6-8 hours for aches or fever/chills for next few days as needed.  Do not take more than 3,000 mg in 24 hours from all medicines.  May take Ibuprofen as well if tolerated 200-400mg  every 8 hours as needed. May alternate tylenol and ibuprofen in same day. - Use heat and ice.  Apply this for 15 minutes at a time 6-8 times per day.   - Muscle rub with lidocaine, lidocaine patch, Biofreeze, or tiger balm for topical pain relief.  Avoid using this with heat and ice to avoid burns. - START muscle relaxer baclofen 10 mg one tablet up to three times daily.   Take only as needed. This can cause drowsiness, so use caution.  It may be best to only take this at night for helping you during sleep.  Please schedule a follow-up appointment with Cassell Smiles, AGNP. Return 4-6 weeks if symptoms worsen or fail to improve.  If you have any other questions or concerns, please feel free to call the clinic or send a message through [place]. You may also schedule an earlier appointment if necessary.  You will receive a survey after today's visit either digitally by e-mail or paper by [employer]. Your experiences and feedback matter to [place].  Please respond so we know how we are doing as we provide care for you.  Cassell Smiles, DNP, AGNP-BC Adult Gerontology Nurse Practitioner [facility], [facility]  Low Back Pain Exercises See other page with pictures of each exercise.  Start with 1 or 2 of these exercises that you are most comfortable with. Do not do any exercises that cause you significant worsening pain. Some of these may cause some "stretching soreness" but it should go away after you stop the exercise, and get better over time. Gradually increase up to 3-4 exercises as tolerated.  Standing hamstring stretch: Place the heel of your leg on a stool about  15 inches high. Keep your knee straight. Lean forward, bending at the hips until you feel a mild stretch in the back of your thigh. Make sure you do not roll your shoulders and bend at the waist when doing this or you will stretch your lower back instead. Hold the stretch for 15 to 30 seconds. Repeat 3 times. Repeat the same stretch on your other leg.  Cat and camel: Get down on your hands and knees. Let your stomach sag, allowing your back to curve downward. Hold this position for 5 seconds. Then arch your back and hold for 5 seconds. Do 3 sets of 10.  Quadriped Arm/Leg Raises: Get down on your hands and knees. Tighten your abdominal muscles to stiffen your spine. While keeping your abdominals tight, raise one arm and the opposite leg away from you. Hold this position for 5 seconds. Lower your arm and leg slowly and alternate sides. Do this 10 times on each side.  Pelvic tilt: Lie on your back with your knees bent and your feet flat on the floor. Tighten your abdominal muscles and push your lower back into the floor. Hold this position for 5 seconds, then relax. Do 3 sets of 10.  Partial curl: Lie on your back with your knees bent and your feet flat on the floor. Tighten your stomach muscles and flatten your back against the floor. Tuck your  chin to your chest. With your hands stretched out in front of you, curl your upper body forward until your shoulders clear the floor. Hold this position for 3 seconds. Don't hold your breath. It helps to breathe out as you lift your shoulders up. Relax. Repeat 10 times. Build to 3 sets of 10. To challenge yourself, clasp your hands behind your head and keep your elbows out to the side.  Lower trunk rotation: Lie on your back with your knees bent and your feet flat on the floor. Tighten your abdominal muscles and push your lower back into the floor. Keeping your shoulders down flat, gently rotate your legs to one side, then the other as far as you can. Repeat 10 to 20  times.  Single knee to chest stretch: Lie on your back with your legs straight out in front of you. Bring one knee up to your chest and grasp the back of your thigh. Pull your knee toward your chest, stretching your buttock muscle. Hold this position for 15 to 30 seconds and return to the starting position. Repeat 3 times on each side.  Double knee to chest: Lie on your back with your knees bent and your feet flat on the floor. Tighten your abdominal muscles and push your lower back into the floor. Pull both knees up to your chest. Hold for 5 seconds and repeat 10 to 20 times.

## 2019-03-07 NOTE — Progress Notes (Signed)
Subjective:    Patient ID: Martha Hudson, female    DOB: 1951-05-01, 68 y.o.   MRN: [id number]  Martha Hudson is a 68 y.o. female presenting on 03/07/2019 for Back Pain ( mid and lower back pain. bending make the pain worse x 1.5 weeks )   HPI Thoracic back pain Pain onset about 2 weeks ago.  Pain started when she woke up from sleep.  No abnormal activities day prior.  Not regularly bending/lifting.  Bending makes pain worse. Straining to poop increases pain - Has been using cupping techniques for pain without relief.  Massage without relief. - No OTC medications have been used. - Ice or heat - not helpful.  Muscle rub is not helpful.  Social History   Tobacco Use  . Smoking status: Former Smoker    Packs/day: 0.75    Years: 50.00    Pack years: 37.50    Types: Cigarettes  . Smokeless tobacco: Never Used  Substance Use Topics  . Alcohol use: No  . Drug use: No    Review of Systems Per HPI unless specifically indicated above     Objective:    BP 110/66 (BP Location: Right Arm, Patient Position: Sitting, Cuff Size: Normal)   Pulse 88   Ht 4\' 7"  (1.397 m)   Wt 128 lb (58.1 kg)   BMI 29.75 kg/m   Wt Readings from Last 3 Encounters:  03/07/19 128 lb (58.1 kg)  06/08/17 118 lb 9.6 oz (53.8 kg)  04/20/17 120 lb (54.4 kg)    Physical Exam Vitals signs reviewed.  Constitutional:      General: She is not in acute distress.    Appearance: She is well-developed.  HENT:     Head: Normocephalic and atraumatic.  Neck:     Musculoskeletal: Normal range of motion.  Cardiovascular:     Rate and Rhythm: Normal rate and regular rhythm.     Pulses: Normal pulses.     Heart sounds: Normal heart sounds.  Pulmonary:     Effort: Pulmonary effort is normal.     Breath sounds: Normal breath sounds.  Musculoskeletal:     Comments: Thoracic and Low Back Inspection: Normal appearance, Normal body habitus, no spinal deformity, symmetrical. Palpation: No tenderness over spinous processes.  Bilateral thoracic and lumbar paraspinal muscles tender and with hypertonicity/spasm. ROM: Limited AROM forward flex / back extension, rotation L/R with mild discomfort Special Testing: Seated SLR negative for radicular pain bilaterally. Standing facet load test negative R/L back pain Strength: Bilateral hip flex/ext 5/5, knee flex/ext 5/5, ankle dorsiflex/plantarflex 5/5 Neurovascular: intact distal sensation to light touch   Skin:    General: Skin is warm and dry.  Neurological:     Mental Status: She is alert and oriented to person, place, and time.  Psychiatric:        Behavior: Behavior normal.     Results for orders placed or performed in visit on 06/08/17  CBC with Differential/Platelet  Result Value Ref Range   WBC 6.3 3.8 - 10.8 Thousand/uL   RBC 4.57 3.80 - 5.10 Million/uL   Hemoglobin 13.9 11.7 - 15.5 g/dL   HCT 41.9 35.0 - 45.0 %   MCV 91.7 80.0 - 100.0 fL   MCH 30.4 27.0 - 33.0 pg   MCHC 33.2 32.0 - 36.0 g/dL   RDW 11.9 11.0 - 15.0 %   Platelets 200 140 - 400 Thousand/uL   MPV 10.9 7.5 - 12.5 fL   Neutro Abs 3,257  1,500 - 7,800 cells/uL   Lymphs Abs 2,426 850 - 3,900 cells/uL   WBC mixed population 428 200 - 950 cells/uL   Eosinophils Absolute 139 15 - 500 cells/uL   Basophils Absolute 50 0 - 200 cells/uL   Neutrophils Relative % 51.7 %   Total Lymphocyte 38.5 %   Monocytes Relative 6.8 %   Eosinophils Relative 2.2 %   Basophils Relative 0.8 %  Comprehensive metabolic panel  Result Value Ref Range   Glucose, Bld 105 65 - 139 mg/dL   BUN 19 7 - 25 mg/dL   Creat 0.85 0.50 - 0.99 mg/dL   BUN/Creatinine Ratio NOT APPLICABLE 6 - 22 (calc)   Sodium 144 135 - 146 mmol/L   Potassium 4.2 3.5 - 5.3 mmol/L   Chloride 107 98 - 110 mmol/L   CO2 27 20 - 32 mmol/L   Calcium 9.5 8.6 - 10.4 mg/dL   Total Protein 7.2 6.1 - 8.1 g/dL   Albumin 4.6 3.6 - 5.1 g/dL   Globulin 2.6 1.9 - 3.7 g/dL (calc)   AG Ratio 1.8 1.0 - 2.5 (calc)   Total Bilirubin 0.4 0.2 - 1.2 mg/dL    Alkaline phosphatase (APISO) 74 33 - 130 U/L   AST 15 10 - 35 U/L   ALT 17 6 - 29 U/L  Lipid panel  Result Value Ref Range   Cholesterol 255 (H) <200 mg/dL   HDL 42 (L) >50 mg/dL   Triglycerides 247 (H) <150 mg/dL   LDL Cholesterol (Calc) 171 (H) mg/dL (calc)   Total CHOL/HDL Ratio 6.1 (H) <5.0 (calc)   Non-HDL Cholesterol (Calc) 213 (H) <130 mg/dL (calc)  Hepatitis C antibody  Result Value Ref Range   Hepatitis C Ab NON-REACTIVE NON-REACTI   SIGNAL TO CUT-OFF 0.02 <1.00  HIV antibody  Result Value Ref Range   HIV 1&2 Ab, 4th Generation NON-REACTIVE NON-REACTI  POCT Wet Prep ([place])  Result Value Ref Range   Source Wet Prep POC vagina    WBC, Wet Prep HPF POC     Bacteria Wet Prep HPF POC Few Few   BACTERIA WET PREP MORPHOLOGY POC     Clue Cells Wet Prep HPF POC None None   Clue Cells Wet Prep Whiff POC Negative Whiff    Yeast Wet Prep HPF POC None    KOH Wet Prep POC     Trichomonas Wet Prep HPF POC Absent Absent  Pap IG and Chlamydia/Gonococcus, NAA  Result Value Ref Range   Clinical Information:     LMP:     PREV. PAP:     PREV. BX:     HPV DNA Probe-Source     STATEMENT OF ADEQUACY:     INTERPRETATION/RESULT:     Comment:     CYTOTECHNOLOGIST:     REVIEW CYTOTECHNOLOGIST:     C. trachomatis RNA, TMA NOT DETECTED NOT DETECT   N. gonorrhoeae RNA, TMA NOT DETECTED NOT DETECT      Assessment & Plan:   Problem List Items Addressed This Visit    None    Visit Diagnoses    Acute left-sided low back pain without sciatica    -  Primary   Relevant Medications   baclofen (LIORESAL) 10 MG tablet    Pain likely self-limited.  Muscle strain possible complicated by overuse injuries, chronic muscle hypertonicity.  Plan:  1. Treat with OTC pain meds (acetaminophen and ibuprofen).  Discussed alternate dosing and max dosing. 2. Apply heat and/or ice to  affected area. 3. May also apply a muscle rub with lidocaine or lidocaine patch after heat or ice. 4. Take muscle  relaxer baclofen 10 mg up to three times daily.  Cautioned drowsiness.  Discussed is only PRN nedication. 5. Offered physical therapy, patient declines at this time. 6. Follow up 4-6 weeks prn.    Meds ordered this encounter  Medications  . baclofen (LIORESAL) 10 MG tablet    Sig: Take 1 tablet (10 mg total) by mouth 3 (three) times daily as needed for muscle spasms.    Dispense:  30 each    Refill:  0    Order Specific Question:   Supervising Provider    Answer:   Olin Hauser [[id number]]   Follow up plan: Return 4-6 weeks if symptoms worsen or fail to improve.  Cassell Smiles, DNP, AGPCNP-BC Adult Gerontology Primary Care Nurse Practitioner [facility] 03/07/2019, 2:45 PM

## 2019-03-08 IMAGING — CT CT CHEST LUNG CANCER SCREENING LOW DOSE W/O CM
1 of 2 series · 15 of 37 positions shown, 19 images · non-contrast
Comparison: None.

CLINICAL DATA: 66-year-old asymptomatic female current smoker with
37.5 pack-year smoking history.

EXAM:
CT CHEST WITHOUT CONTRAST LOW-DOSE FOR LUNG CANCER SCREENING
TECHNIQUE: Multidetector CT imaging of the chest was performed following the
standard protocol without IV contrast.

[Series 2: axial st · axial · 0.71mm/px · z∈[-544,-318]mm · 15 of 49 slices shown, 19 images]
[im 2/49  mediastinal]
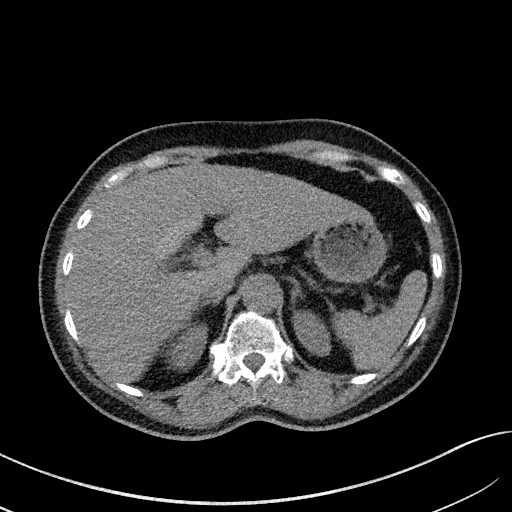
[im 2/49  lung]
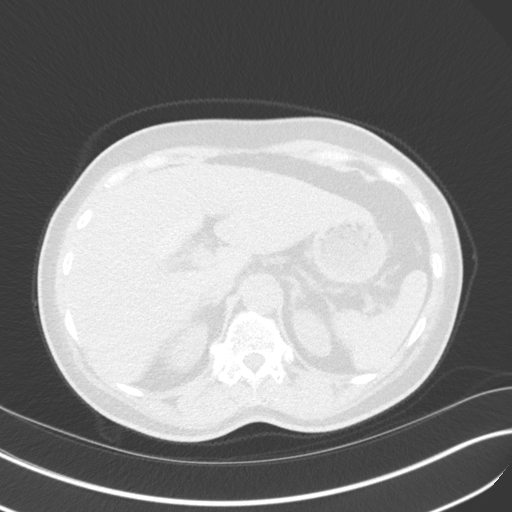
[im 6/49  lung]
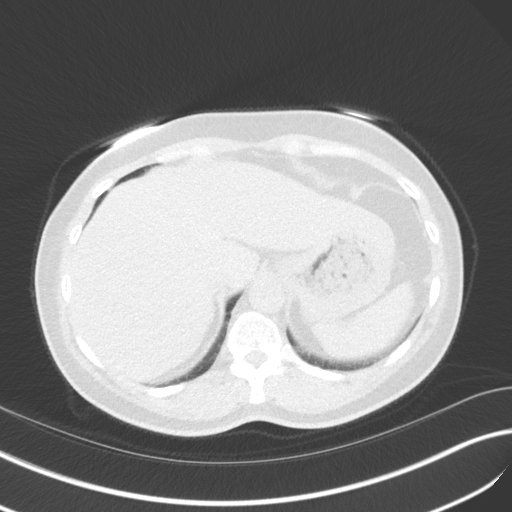
[im 10/49  lung]
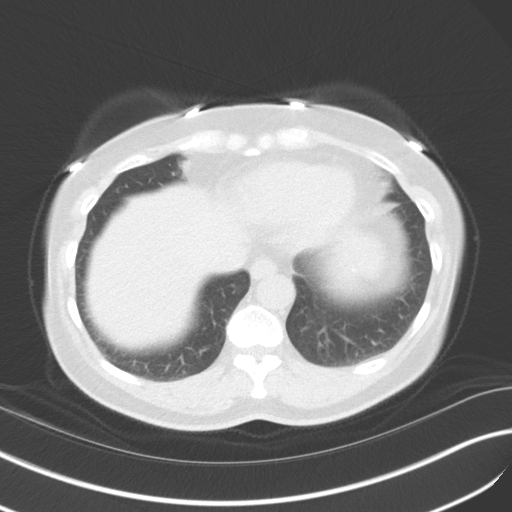
[im 12/49  lung]
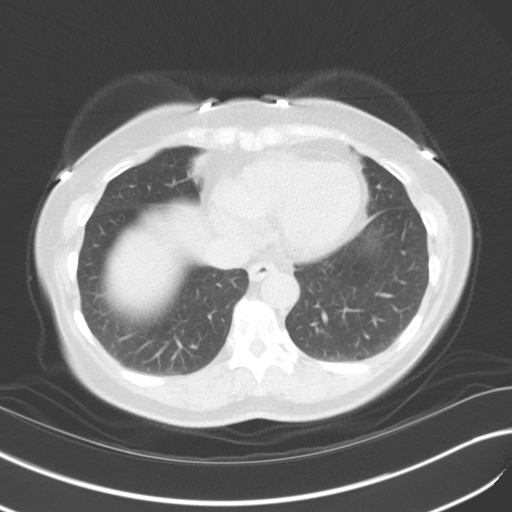
[im 15/49  mediastinal]
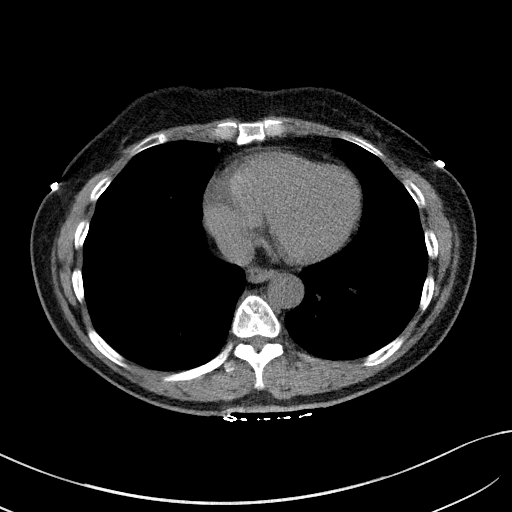
[im 15/49  lung]
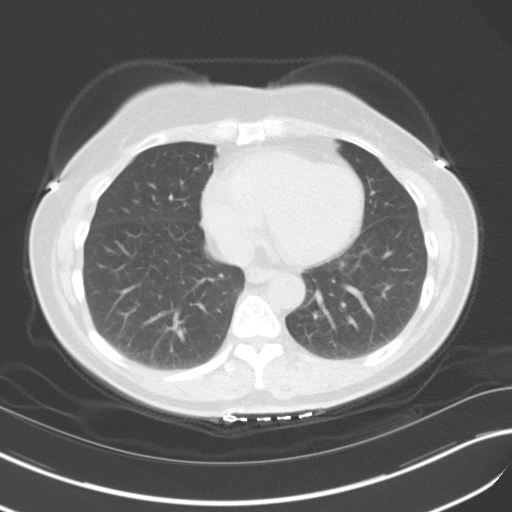
[im 19/49  lung]
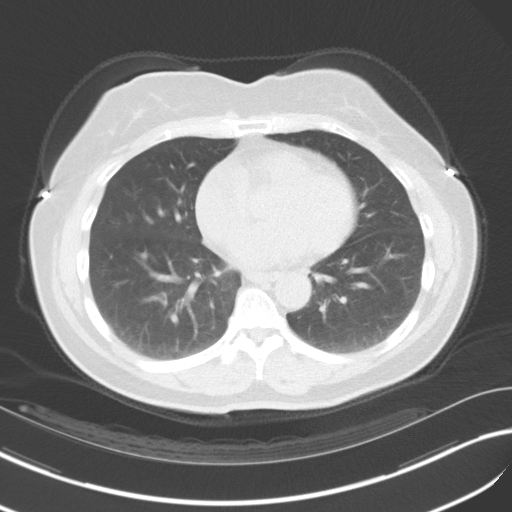
[im 21/49  lung]
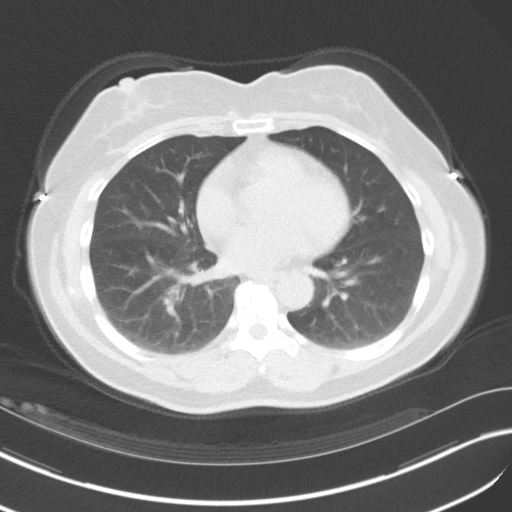
[im 25/49  lung]
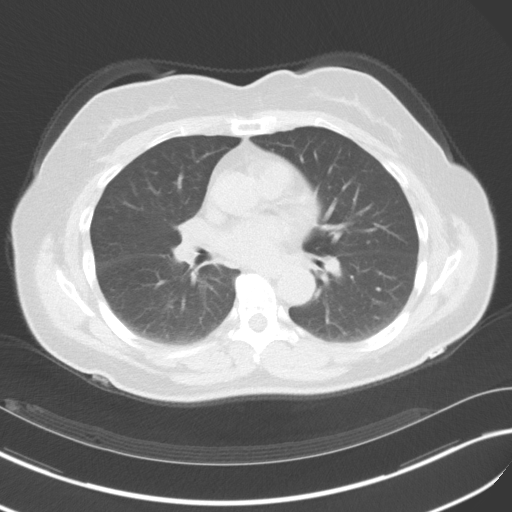
[im 28/49  mediastinal]
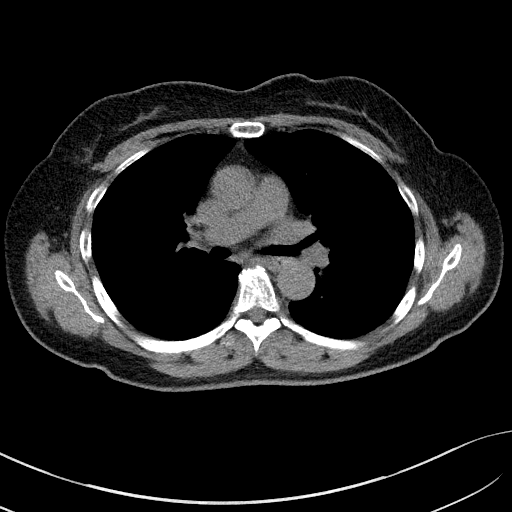
[im 28/49  lung]
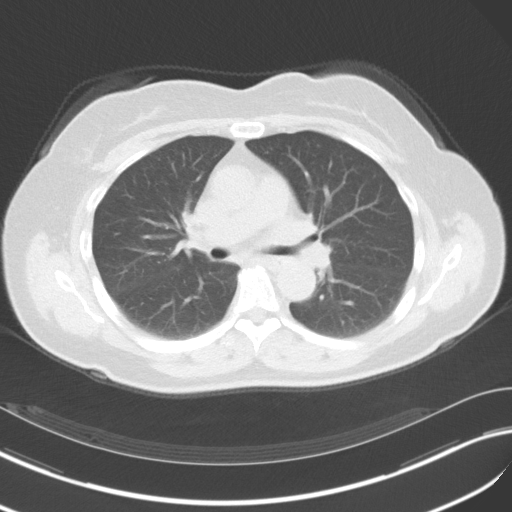
[im 30/49  lung]
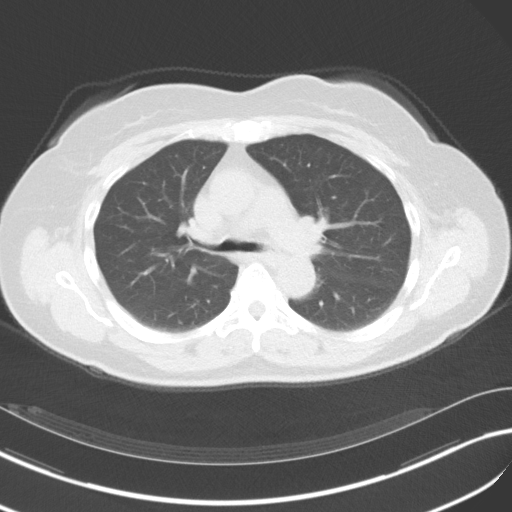
[im 34/49  lung]
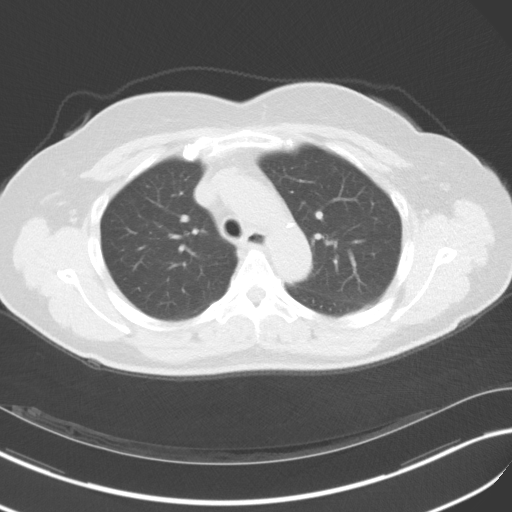
[im 37/49  lung]
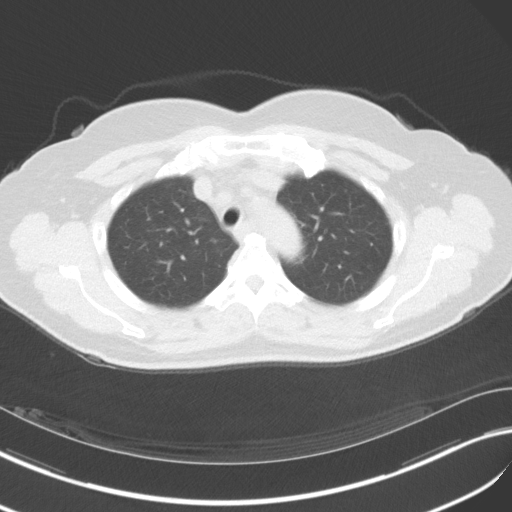
[im 39/49  mediastinal]
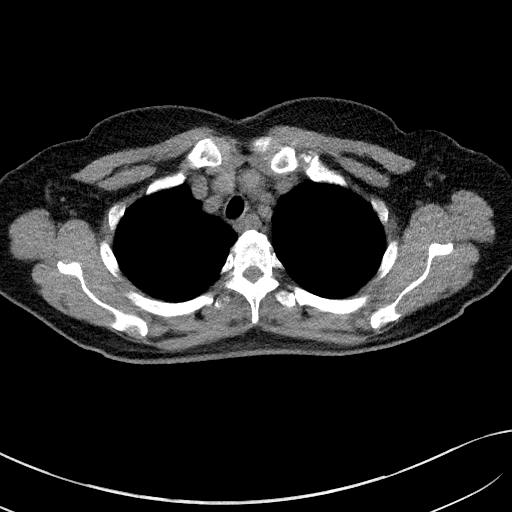
[im 39/49  lung]
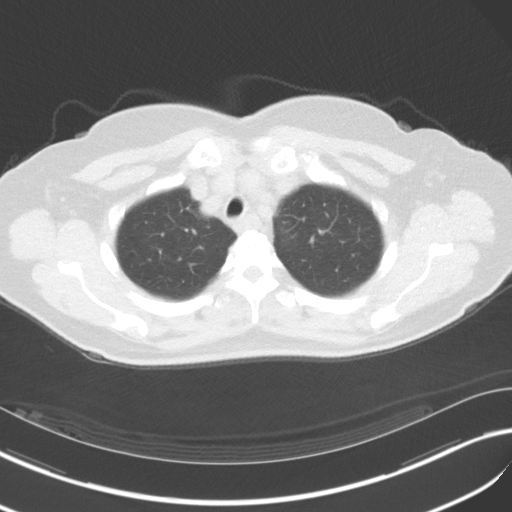
[im 43/49  lung]
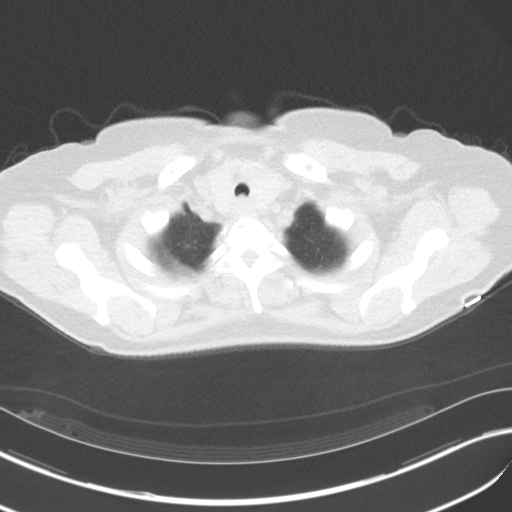
[im 47/49  lung]
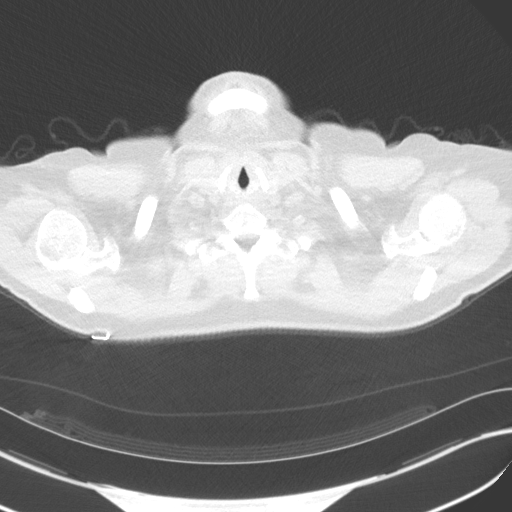

[15 of 37 positions shown; findings below may reference images not displayed]

FINDINGS: Motion degraded scan.

Cardiovascular: Normal heart size. No significant pericardial
fluid/thickening. Mildly atherosclerotic nonaneurysmal thoracic
aorta. Normal caliber pulmonary arteries.

Mediastinum/Nodes: Partially calcified 1.7 cm hypodense left thyroid
lobe nodule. Unremarkable esophagus. No pathologically enlarged
axillary, mediastinal or gross hilar lymph nodes, noting limited
sensitivity for the detection of hilar adenopathy on this
noncontrast study.

Lungs/Pleura: No pneumothorax. No pleural effusion. Mild
centrilobular emphysema. No acute consolidative airspace disease or
lung masses. Two tiny solid pulmonary nodules in the right lung,
largest 2.9 mm in volume derived mean diameter in the right upper
lobe (series 3/ image 45).

Upper abdomen: Unremarkable.

Musculoskeletal: No aggressive appearing focal osseous lesions.
Moderate thoracic spondylosis.
IMPRESSION: Lung-RADS 2, benign appearance or behavior. Continue annual
screening with low-dose chest CT without contrast in 12 months.

Partially calcified 1.7 cm hypodense left thyroid lobe nodule, for
which thyroid ultrasound correlation is warranted. This follows ACR
consensus guidelines: Managing Incidental Thyroid Nodules Detected
on Imaging: White Paper of [REDACTED]. [HOSPITAL] 2505; [DATE].

Aortic Atherosclerosis (SWV9H-0RH.H) and Emphysema (SWV9H-YPQ.B).

## 2019-03-13 ENCOUNTER — Encounter: Payer: Self-pay | Admitting: Nurse Practitioner

## 2019-03-15 DIAGNOSIS — C73 Malignant neoplasm of thyroid gland: Secondary | ICD-10-CM | POA: Diagnosis not present

## 2019-03-15 DIAGNOSIS — E042 Nontoxic multinodular goiter: Secondary | ICD-10-CM | POA: Diagnosis not present

## 2019-03-29 ENCOUNTER — Other Ambulatory Visit: Payer: Self-pay

## 2019-03-29 ENCOUNTER — Telehealth: Payer: Self-pay | Admitting: Nurse Practitioner

## 2019-03-29 ENCOUNTER — Ambulatory Visit (INDEPENDENT_AMBULATORY_CARE_PROVIDER_SITE_OTHER): Payer: Medicare Other

## 2019-03-29 DIAGNOSIS — E042 Nontoxic multinodular goiter: Secondary | ICD-10-CM | POA: Diagnosis not present

## 2019-03-29 DIAGNOSIS — M545 Low back pain, unspecified: Secondary | ICD-10-CM

## 2019-03-29 DIAGNOSIS — Z23 Encounter for immunization: Secondary | ICD-10-CM

## 2019-03-29 NOTE — Telephone Encounter (Signed)
I also recommended physical therapy.  She should start physical therapy so we can proceed with imaging in future if pain lasts 6 weeks after physical therapy.  I have placed the order.  Please call patient with interpreter to share this information.

## 2019-03-29 NOTE — Telephone Encounter (Signed)
Pt said that the mediation that was given to her for her back was not working wanted to know if you would call something else in

## 2019-03-30 MED ORDER — CYCLOBENZAPRINE HCL 5 MG PO TABS: 5.0000 mg | ORAL_TABLET | Freq: Three times a day (TID) | ORAL | 1 refills | Status: DC | PRN

## 2019-03-30 NOTE — Telephone Encounter (Signed)
The pt doesn't want to proceed with PT. She said that you told her if the medication doesn't work to let you know and you will change the medication.

## 2019-06-27 ENCOUNTER — Ambulatory Visit (INDEPENDENT_AMBULATORY_CARE_PROVIDER_SITE_OTHER): Payer: Medicare Other

## 2019-06-27 VITALS — Ht <= 58 in | Wt 126.0 lb

## 2019-06-27 DIAGNOSIS — Z0000 Encounter for general adult medical examination without abnormal findings: Secondary | ICD-10-CM | POA: Diagnosis not present

## 2019-06-27 DIAGNOSIS — Z1231 Encounter for screening mammogram for malignant neoplasm of breast: Secondary | ICD-10-CM | POA: Diagnosis not present

## 2019-06-27 NOTE — Patient Instructions (Signed)
[name] , Thank you for taking time to come for your Medicare Wellness Visit. I appreciate your ongoing commitment to your health goals. Please review the following plan we discussed and let me know if I can assist you in the future.   Screening recommendations/referrals: Colonoscopy: up to date  Mammogram: Please call [phone] to schedule your mammogram.  Bone Density: up to date Recommended yearly ophthalmology/optometry visit for glaucoma screening and checkup Recommended yearly dental visit for hygiene and checkup  Vaccinations: Influenza vaccine: up to date Pneumococcal vaccine: due now  Tdap vaccine: up to date Shingles vaccine: shingrix eligible     Advanced directives: discussed living will and health care power of attorney   Conditions/risks identified: none   Next appointment: Follow up in one year for your annual wellness visit    Preventive Care 68 Years and Older, Female Preventive care refers to lifestyle choices and visits with your health care provider that can promote health and wellness. What does preventive care include?  A yearly physical exam. This is also called an annual well check.  Dental exams once or twice a year.  Routine eye exams. Ask your health care provider how often you should have your eyes checked.  Personal lifestyle choices, including:  Daily care of your teeth and gums.  Regular physical activity.  Eating a healthy diet.  Avoiding tobacco and drug use.  Limiting alcohol use.  Practicing safe sex.  Taking low-dose aspirin every day.  Taking vitamin and mineral supplements as recommended by your health care provider. What happens during an annual well check? The services and screenings done by your health care provider during your annual well check will depend on your age, overall health, lifestyle risk factors, and family history of disease. Counseling  Your health care provider may ask you questions about your:  Alcohol  use.  Tobacco use.  Drug use.  Emotional well-being.  Home and relationship well-being.  Sexual activity.  Eating habits.  History of falls.  Memory and ability to understand (cognition).  Work and work Statistician.  Reproductive health. Screening  You may have the following tests or measurements:  Height, weight, and BMI.  Blood pressure.  Lipid and cholesterol levels. These may be checked every 5 years, or more frequently if you are over 59 years old.  Skin check.  Lung cancer screening. You may have this screening every year starting at age 71 if you have a 30-pack-year history of smoking and currently smoke or have quit within the past 15 years.  Fecal occult blood test (FOBT) of the stool. You may have this test every year starting at age 42.  Flexible sigmoidoscopy or colonoscopy. You may have a sigmoidoscopy every 5 years or a colonoscopy every 10 years starting at age 33.  Hepatitis C blood test.  Hepatitis B blood test.  Sexually transmitted disease (STD) testing.  Diabetes screening. This is done by checking your blood sugar (glucose) after you have not eaten for a while (fasting). You may have this done every 1-3 years.  Bone density scan. This is done to screen for osteoporosis. You may have this done starting at age 50.  Mammogram. This may be done every 1-2 years. Talk to your health care provider about how often you should have regular mammograms. Talk with your health care provider about your test results, treatment options, and if necessary, the need for more tests. Vaccines  Your health care provider may recommend certain vaccines, such as:  Influenza vaccine.  This is recommended every year.  Tetanus, diphtheria, and acellular pertussis (Tdap, Td) vaccine. You may need a Td booster every 10 years.  Zoster vaccine. You may need this after age 67.  Pneumococcal 13-valent conjugate (PCV13) vaccine. One dose is recommended after age  71.  Pneumococcal polysaccharide (PPSV23) vaccine. One dose is recommended after age 86. Talk to your health care provider about which screenings and vaccines you need and how often you need them. This information is not intended to replace advice given to you by your health care provider. Make sure you discuss any questions you have with your health care provider. Document Released: 07/05/2015 Document Revised: 02/26/2016 Document Reviewed: 04/09/2015 Elsevier Interactive Patient Education  2017 [place] Prevention in the Home Falls can cause injuries. They can happen to people of all ages. There are many things you can do to make your home safe and to help prevent falls. What can I do on the outside of my home?  Regularly fix the edges of walkways and driveways and fix any cracks.  Remove anything that might make you trip as you walk through a door, such as a raised step or threshold.  Trim any bushes or trees on the path to your home.  Use bright outdoor lighting.  Clear any walking paths of anything that might make someone trip, such as rocks or tools.  Regularly check to see if handrails are loose or broken. Make sure that both sides of any steps have handrails.  Any raised decks and porches should have guardrails on the edges.  Have any leaves, snow, or ice cleared regularly.  Use sand or salt on walking paths during winter.  Clean up any spills in your garage right away. This includes oil or grease spills. What can I do in the bathroom?  Use night lights.  Install grab bars by the toilet and in the tub and shower. Do not use towel bars as grab bars.  Use non-skid mats or decals in the tub or shower.  If you need to sit down in the shower, use a plastic, non-slip stool.  Keep the floor dry. Clean up any water that spills on the floor as soon as it happens.  Remove soap buildup in the tub or shower regularly.  Attach bath mats securely with double-sided  non-slip rug tape.  Do not have throw rugs and other things on the floor that can make you trip. What can I do in the bedroom?  Use night lights.  Make sure that you have a light by your bed that is easy to reach.  Do not use any sheets or blankets that are too big for your bed. They should not hang down onto the floor.  Have a firm chair that has side arms. You can use this for support while you get dressed.  Do not have throw rugs and other things on the floor that can make you trip. What can I do in the kitchen?  Clean up any spills right away.  Avoid walking on wet floors.  Keep items that you use a lot in easy-to-reach places.  If you need to reach something above you, use a strong step stool that has a grab bar.  Keep electrical cords out of the way.  Do not use floor polish or wax that makes floors slippery. If you must use wax, use non-skid floor wax.  Do not have throw rugs and other things on the floor that can make  you trip. What can I do with my stairs?  Do not leave any items on the stairs.  Make sure that there are handrails on both sides of the stairs and use them. Fix handrails that are broken or loose. Make sure that handrails are as long as the stairways.  Check any carpeting to make sure that it is firmly attached to the stairs. Fix any carpet that is loose or worn.  Avoid having throw rugs at the top or bottom of the stairs. If you do have throw rugs, attach them to the floor with carpet tape.  Make sure that you have a light switch at the top of the stairs and the bottom of the stairs. If you do not have them, ask someone to add them for you. What else can I do to help prevent falls?  Wear shoes that:  Do not have high heels.  Have rubber bottoms.  Are comfortable and fit you well.  Are closed at the toe. Do not wear sandals.  If you use a stepladder:  Make sure that it is fully opened. Do not climb a closed stepladder.  Make sure that both  sides of the stepladder are locked into place.  Ask someone to hold it for you, if possible.  Clearly mark and make sure that you can see:  Any grab bars or handrails.  First and last steps.  Where the edge of each step is.  Use tools that help you move around (mobility aids) if they are needed. These include:  Canes.  Walkers.  Scooters.  Crutches.  Turn on the lights when you go into a dark area. Replace any light bulbs as soon as they burn out.  Set up your furniture so you have a clear path. Avoid moving your furniture around.  If any of your floors are uneven, fix them.  If there are any pets around you, be aware of where they are.  Review your medicines with your doctor. Some medicines can make you feel dizzy. This can increase your chance of falling. Ask your doctor what other things that you can do to help prevent falls. This information is not intended to replace advice given to you by your health care provider. Make sure you discuss any questions you have with your health care provider. Document Released: 04/04/2009 Document Revised: 11/14/2015 Document Reviewed: 07/13/2014 Elsevier Interactive Patient Education  2017 [employer].

## 2019-06-27 NOTE — Progress Notes (Signed)
Subjective:   Martha Hudson is a 69 y.o. female who presents for an Initial Medicare Annual Wellness Visit.  This visit is being conducted via phone call  - after an attmept to do on video chat - due to the COVID-19 pandemic. This patient has given me verbal consent via phone to conduct this visit, patient states they are participating from their home address. Some vital signs may be absent or patient reported.   Patient identification: identified by name, DOB, and current address.    Review of Systems      Cardiac Risk Factors include: advanced age (>82men, >9 women);dyslipidemia;smoking/ tobacco exposure     Objective:    Today's Vitals   06/27/19 1324  Weight: 126 lb (57.2 kg)  Height: 4\' 8"  (1.422 m)   Body mass index is 28.25 kg/m.  Advanced Directives 06/27/2019 06/08/2017  Does Patient Have a Medical Advance Directive? No No  Would patient like information on creating a medical advance directive? - Yes (MAU/Ambulatory/Procedural Areas - Information given)    Current Medications (verified) Outpatient Encounter Medications as of 06/27/2019  Medication Sig  . cyclobenzaprine (FLEXERIL) 5 MG tablet Take 1 tablet (5 mg total) by mouth 3 (three) times daily as needed for muscle spasms.  Marland Kitchen omeprazole (PRILOSEC) 20 MG capsule Take 1 capsule (20 mg total) by mouth daily.  . [DISCONTINUED] atorvastatin (LIPITOR) 20 MG tablet Take 1 tablet (20 mg total) by mouth daily. (Patient not taking: Reported on 03/07/2019)   No facility-administered encounter medications on file as of 06/27/2019.    Allergies (verified) Patient has no known allergies.   History: Past Medical History:  Diagnosis Date  . Kidney stone    possible kidney stone.  Pt is poor historian, but knows she "had a stone" at about age 76- no recurrences.   Past Surgical History:  Procedure Laterality Date  . NO PAST SURGERIES     Family History  Problem Relation Age of Onset  . Healthy Daughter   . Breast cancer  Neg Hx    Social History   Socioeconomic History  . Marital status: Divorced    Spouse name: Not on file  . Number of children: Not on file  . Years of education: Not on file  . Highest education level: Not on file  Occupational History  . Not on file  Tobacco Use  . Smoking status: Former Smoker    Packs/day: 0.75    Years: 50.00    Pack years: 37.50    Types: Cigarettes    Quit date: 05/2018    Years since quitting: 1.0  . Smokeless tobacco: Never Used  Substance and Sexual Activity  . Alcohol use: No  . Drug use: No  . Sexual activity: Not on file  Other Topics Concern  . Not on file  Social History Narrative   Pt is immigrant to [place] from [place] in 1999. She speaks [place] and only a minimal amount of broken [place]. Medical interpreter needed for all encounters.   Social Determinants of Health   Financial Resource Strain:   . Difficulty of Paying Living Expenses: Not on file  Food Insecurity:   . Worried About Charity fundraiser in the Last Year: Not on file  . Ran Out of Food in the Last Year: Not on file  Transportation Needs:   . Lack of Transportation (Medical): Not on file  . Lack of Transportation (Non-Medical): Not on file  Physical Activity:   . Days of  Exercise per Week: Not on file  . Minutes of Exercise per Session: Not on file  Stress:   . Feeling of Stress : Not on file  Social Connections:   . Frequency of Communication with Friends and Family: Not on file  . Frequency of Social Gatherings with Friends and Family: Not on file  . Attends Religious Services: Not on file  . Active Member of Clubs or Organizations: Not on file  . Attends Archivist Meetings: Not on file  . Marital Status: Not on file    Tobacco Counseling Counseling given: Not Answered   Clinical Intake:  Pre-visit preparation completed: Yes  Pain : No/denies pain     Nutritional Status: BMI 25 -29 Overweight Nutritional Risks: None Diabetes: No  How  often do you need to have someone help you when you read instructions, pamphlets, or other written materials from your doctor or pharmacy?: 1 - Never  Interpreter Needed?: Yes Interpreter Agency: [facility] interpretor Interpreter Name: Pryor Curia ID: [id number] Patient Declined Interpreter : No Patient signed [place] waiver: Yes  Information entered by :: Adeel Guiffre,LPN   Activities of Daily Living In your present state of health, do you have any difficulty performing the following activities: 06/27/2019 02/03/2019  Hearing? N N  Comment no hearing aids -  Vision? N Y  Comment Dr.Shade, eyeglasses -  Difficulty concentrating or making decisions? N N  Walking or climbing stairs? Y N  Comment weakness -  Dressing or bathing? N N  Doing errands, shopping? N N  Preparing Food and eating ? N -  Using the Toilet? N -  In the past six months, have you accidently leaked urine? N -  Do you have problems with loss of bowel control? N -  Managing your Medications? N -  Managing your Finances? N -  Housekeeping or managing your Housekeeping? N -  Some recent data might be hidden     Immunizations and Health Maintenance Immunization History  Administered Date(s) Administered  . Fluad Quad(high Dose 65+) 03/29/2019  . Pneumococcal Conjugate-13 06/08/2017  . Tdap 01/16/2013   Health Maintenance Due  Topic Date Due  . PNA vac Low Risk Adult (2 of 2 - PPSV23) 06/08/2018    Patient Care Team: Mikey College, NP (Inactive) as PCP - General (Nurse Practitioner) Ocie Doyne, OD as Consulting Physician (Optometry)  Indicate any recent Medical Services you may have received from other than [facility] providers in the past year (date may be approximate).     Assessment:   This is a routine wellness examination for Martha Hudson.  Hearing/Vision screen No exam data present  Dietary issues and exercise activities discussed: Current Exercise Habits: The patient does not participate in  regular exercise at present(gardening when warm), Exercise limited by: None identified  Goals    . Increase physical activity      Depression Screen PHQ 2/9 Scores 06/27/2019 02/03/2019 04/12/2017  PHQ - 2 Score 0 0 0    Fall Risk Fall Risk  06/27/2019 02/03/2019 01/23/2019 04/12/2017  Falls in the past year? 0 0 (No Data) No  Comment - - [facility]i Telephone Survey: data to providers prior to load -  Number falls in past yr: 0 0 (No Data) -  Comment - - Emmi Telephone Survey Actual Response =  -  Injury with Fall? 0 - - -   FALL RISK PREVENTION PERTAINING TO THE HOME:  Any stairs in or around the home? No  If so, are  there any without handrails? No   Home free of loose throw rugs in walkways, pet beds, electrical cords, etc? Yes  Adequate lighting in your home to reduce risk of falls? Yes   ASSISTIVE DEVICES UTILIZED TO PREVENT FALLS:  Life alert? No  Use of a cane, walker or w/c? No  Grab bars in the bathroom? No  Shower chair or bench in shower? No  Elevated toilet seat or a handicapped toilet? No    DME ORDERS:  DME order needed?  No   TIMED UP AND GO:  Unable to perform   Cognitive Function:     6CIT Screen 06/08/2017  What Year? 0 points  What month? 0 points  What time? 0 points  Count back from 20 0 points  Months in reverse 0 points  Repeat phrase 0 points  Total Score 0    Screening Tests Health Maintenance  Topic Date Due  . PNA vac Low Risk Adult (2 of 2 - PPSV23) 06/08/2018  . MAMMOGRAM  07/07/2019  . COLONOSCOPY  08/20/2020  . TETANUS/TDAP  01/17/2023  . INFLUENZA VACCINE  Completed  . DEXA SCAN  Completed  . Hepatitis C Screening  Completed    Qualifies for Shingles Vaccine? Yes  Zostavax completed n/a. Due for Shingrix. Education has been provided regarding the importance of this vaccine. Pt has been advised to call insurance company to determine out of pocket expense. Advised may also receive vaccine at local pharmacy or Health Dept. Verbalized  acceptance and understanding.  Tdap: up to date  Flu Vaccine: up to date  Pneumococcal Vaccine: Due for Pneumococcal vaccine.   Cancer Screenings:  Colorectal Screening: Completed 08/21/2010 Repeat every 10 years  Mammogram: Completed 07/06/2017. Repeat every year;   Bone Density: Completed 07/06/2017.   Lung Cancer Screening: (Low Dose CT Chest recommended if Age 45-80 years, 30 pack-year currently smoking OR have quit w/in 15years.) does not qualify.   Lung Cancer Screening Referral: An Epic message has been sent to Burgess Estelle, RN (Oncology Nurse Navigator) regarding the possible need for this exam. Raquel Sarna will review the patient's chart to determine if the patient truly qualifies for the exam. If the patient qualifies, Raquel Sarna will order the Low Dose CT of the chest to facilitate the scheduling of this exam.  Additional Screening:  Hepatitis C Screening: does qualify; Completed 2018  Vision Screening: Recommended annual ophthalmology exams for early detection of glaucoma and other disorders of the eye. Is the patient up to date with their annual eye exam?  Yes  Who is the provider or what is the name of the office in which the pt attends annual eye exams? [email or web address]   Dental Screening: Recommended annual dental exams for proper oral hygiene  Community Resource Referral:  CRR required this visit?  No       Plan:  I have personally reviewed and addressed the Medicare Annual Wellness questionnaire and have noted the following in the patient's chart:  A. Medical and social history B. Use of alcohol, tobacco or illicit drugs  C. Current medications and supplements D. Functional ability and status E.  Nutritional status F.  Physical activity G. Advance directives H. List of other physicians I.  Hospitalizations, surgeries, and ER visits in previous 12 months J.  [place] such as hearing and vision if needed, cognitive and depression L. Referrals and  appointments   In addition, I have reviewed and discussed with patient certain preventive protocols, quality metrics, and best practice  recommendations. A written personalized care plan for preventive services as well as general preventive health recommendations were provided to patient.   Signed,    Bevelyn Ngo, LPN   [id number]  Nurse Health Advisor   Nurse Notes: is having ongoing headaches. Requesting virtual visit with pcp. Will schedule.

## 2019-07-04 ENCOUNTER — Other Ambulatory Visit: Payer: Self-pay

## 2019-07-04 ENCOUNTER — Ambulatory Visit (INDEPENDENT_AMBULATORY_CARE_PROVIDER_SITE_OTHER): Payer: Medicare Other | Admitting: Family Medicine

## 2019-07-04 ENCOUNTER — Encounter: Payer: Self-pay | Admitting: Family Medicine

## 2019-07-04 DIAGNOSIS — M545 Low back pain: Secondary | ICD-10-CM | POA: Diagnosis not present

## 2019-07-04 DIAGNOSIS — G8929 Other chronic pain: Secondary | ICD-10-CM

## 2019-07-04 DIAGNOSIS — K089 Disorder of teeth and supporting structures, unspecified: Secondary | ICD-10-CM

## 2019-07-04 MED ORDER — CYCLOBENZAPRINE HCL 5 MG PO TABS: 5.0000 mg | ORAL_TABLET | Freq: Three times a day (TID) | ORAL | 1 refills | Status: AC | PRN

## 2019-07-04 MED ORDER — MELOXICAM 15 MG PO TABS: 15.0000 mg | ORAL_TABLET | Freq: Every day | ORAL | 1 refills | Status: DC | PRN

## 2019-07-04 NOTE — Progress Notes (Signed)
Virtual Visit via Telephone The purpose of this virtual visit is to provide medical care while limiting exposure to the novel coronavirus (COVID19) for both patient and office staff.  Consent was obtained for phone visit:  Yes.   Answered questions that patient had about telehealth interaction:  Yes.   I discussed the limitations, risks, security and privacy concerns of performing an evaluation and management service by telephone. I also discussed with the patient that there may be a patient responsible charge related to this service. The patient expressed understanding and agreed to proceed.  Patient Location: Home Provider Location: [facility])  ---------------------------------------------------------------------- Chief Complaint  Patient presents with  . Headache    onset 2-3 month, first started stayed for month with lying down helped and no HA from months now--Interpreter [id number] Trena Platt     S: Reviewed CMA documentation. I have called patient and gathered additional HPI as follows:  Previous PCP Cassell Smiles, AGPCNP-BC  Headache , recurrent - RESOLVED Dental Pain / Toothache (Left upper) Back Pain, Chronic Reports that symptoms started few months ago with headache, but that has improved overall and now resolved. She describes a toothache now as primary complaint, onset started yesterday and continued today, Upper jaw Left 3rd over from center. Describes pain worse with chewing and pressure on tooth and also more sensitive and pain with temperature changes with food/liquids. - Tried OTC pain relief pill last night and today and it did help - No regular dentist. She had similar problem 1 year ago, her daughter took her in to see a dentist and they took X-ray of tooth and said that the root is not secure anymore and they recommended that she return for an extraction, and did not return due to [place]. Now recurrence of pain recently. - Also has chronic low back  pain episodic, asking about medicine to help with that, previously was on Flexeril PRN.  Denies any fevers, chills, sweats, body ache, cough, shortness of breath, sinus pain or pressure, headache, abdominal pain, diarrhea  Past Medical History:  Diagnosis Date  . Kidney stone    possible kidney stone. she "had a stone" at about age 69- no recurrences.   Social History   Tobacco Use  . Smoking status: Former Smoker    Packs/day: 0.75    Years: 50.00    Pack years: 37.50    Types: Cigarettes    Quit date: 05/2018    Years since quitting: 1.1  . Smokeless tobacco: Former Network engineer Use Topics  . Alcohol use: No  . Drug use: No    Current Outpatient Medications:  .  cyclobenzaprine (FLEXERIL) 5 MG tablet, Take 1 tablet (5 mg total) by mouth 3 (three) times daily as needed for muscle spasms (back pain or headache)., Disp: 30 tablet, Rfl: 1 .  meloxicam (MOBIC) 15 MG tablet, Take 1 tablet (15 mg total) by mouth daily as needed for pain., Disp: 30 tablet, Rfl: 1  Depression screen [facility] 2/9 06/27/2019 02/03/2019 04/12/2017  Decreased Interest 0 0 0  Down, Depressed, Hopeless 0 0 0  PHQ - 2 Score 0 0 0    No flowsheet data found.  -------------------------------------------------------------------------- O: No physical exam performed due to remote telephone encounter.  Lab results reviewed.  No results found for this or any previous visit (from the past 2160 hour(s)).  -------------------------------------------------------------------------- A&P:  Problem List Items Addressed This Visit    None    Visit Diagnoses    Chronic dental  pain    -  Primary   Relevant Medications   meloxicam (MOBIC) 15 MG tablet   cyclobenzaprine (FLEXERIL) 5 MG tablet   Chronic low back pain without sciatica, unspecified back pain laterality       Relevant Medications   meloxicam (MOBIC) 15 MG tablet   cyclobenzaprine (FLEXERIL) 5 MG tablet     Clinically with chronic fairly localized  dental pain (left upper) seems to have significant known history of multiple tooth needing dental extraction based on reported history from a year ago with same pain per dentist after x-ray and work-up. - Suspect recurrence of that same problem now. - No new injury or trigger - Headaches may have been actually related. - Also chronic back pain, in past on muscle relaxant PRN  Plan - Recommend that she returns to dentist promptly to discuss next steps in treatment and procedure/extraction if needed, as this is not something that I normally treat - Offered her pain relief in meantime while waiting for dentist - Start Meloxicam 15mg  daily NSAID oral PRN - Refilled Flexeril 5mg  TID PRN for headache/back - Advised that we can consider future stronger pain medicine Hydrocodone / Tramadol in future if indicated for acute more severe pain if not improving, they can notify us if we need to do a 5 day short term only course - Return criteria reviewed   Meds ordered this encounter  Medications  . meloxicam (MOBIC) 15 MG tablet    Sig: Take 1 tablet (15 mg total) by mouth daily as needed for pain.    Dispense:  30 tablet    Refill:  1  . cyclobenzaprine (FLEXERIL) 5 MG tablet    Sig: Take 1 tablet (5 mg total) by mouth 3 (three) times daily as needed for muscle spasms (back pain or headache).    Dispense:  30 tablet    Refill:  1    Follow-up: - Return in 4-6 week as needed   Patient verbalizes understanding with the above medical recommendations including the limitation of remote medical advice.  Specific follow-up and call-back criteria were given for patient to follow-up or seek medical care more urgently if needed.   - Time spent in direct consultation with patient on phone: 12 minutes   Nobie Putnam, [facility] 07/04/2019, 8:41 AM

## 2019-08-27 ENCOUNTER — Other Ambulatory Visit: Payer: Self-pay | Admitting: Family Medicine

## 2019-08-27 DIAGNOSIS — M545 Low back pain, unspecified: Secondary | ICD-10-CM

## 2019-08-27 DIAGNOSIS — G8929 Other chronic pain: Secondary | ICD-10-CM

## 2019-08-27 DIAGNOSIS — K089 Disorder of teeth and supporting structures, unspecified: Secondary | ICD-10-CM

## 2019-11-01 IMAGING — US US THYROID
1 series · 13 of 25 positions shown · non-contrast
Comparison: 04/20/2017

CLINICAL DATA: Incidental on CT.

EXAM:
THYROID ULTRASOUND
TECHNIQUE: Ultrasound examination of the thyroid gland and adjacent soft
tissues was performed.

[Series 1: us thyroid · 0.07mm/px · 13 of 58 slices shown]
[im 1/58]
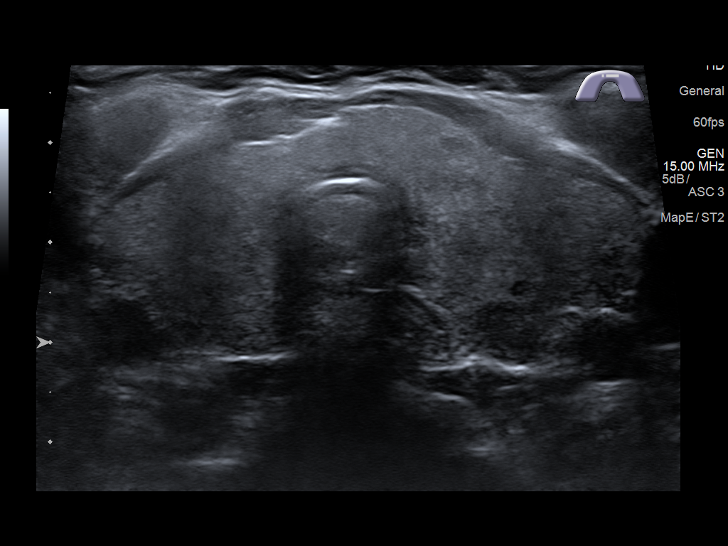
[im 5/58]
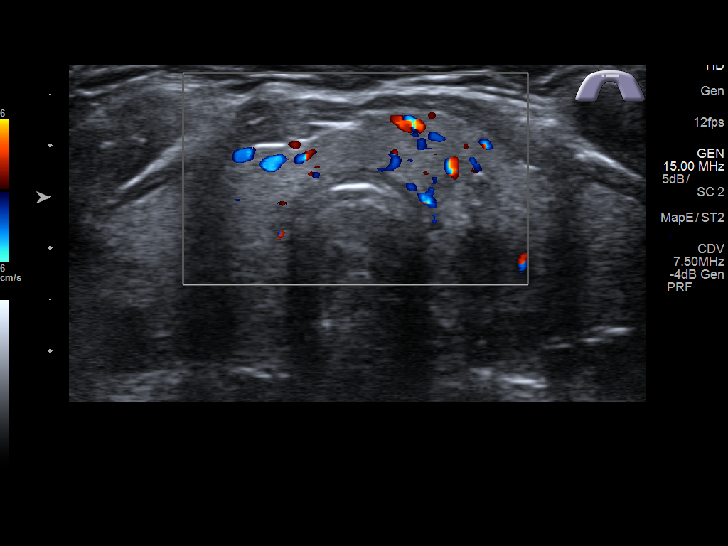
[im 10/58]
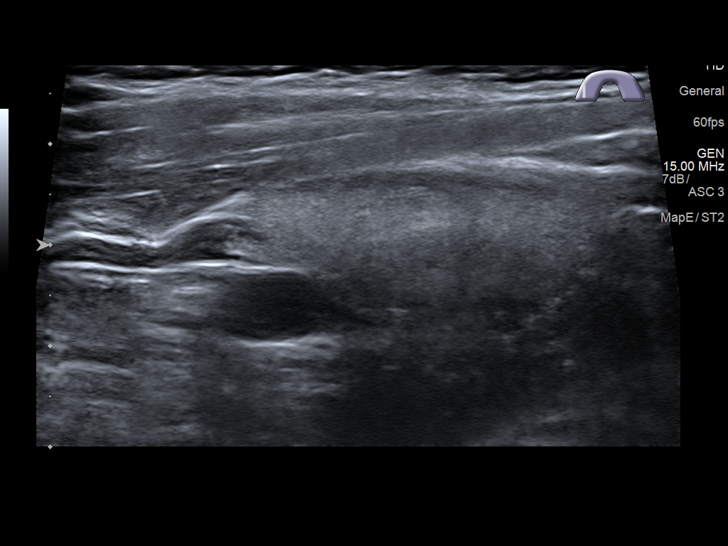
[im 15/58]
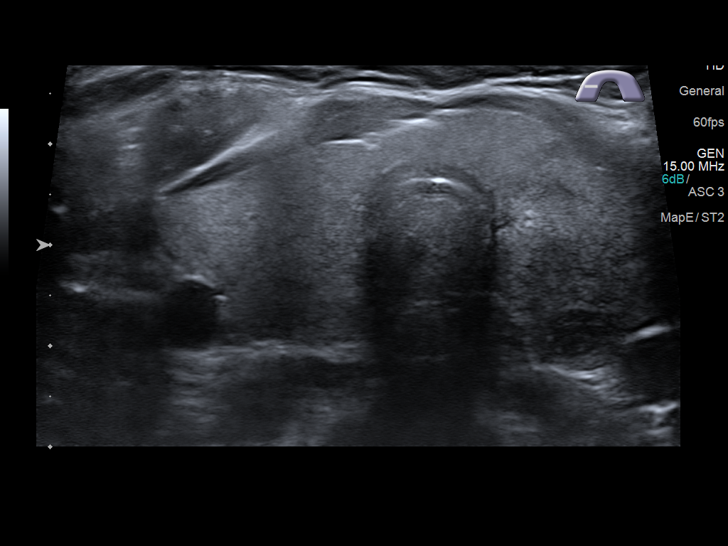
[im 20/58]
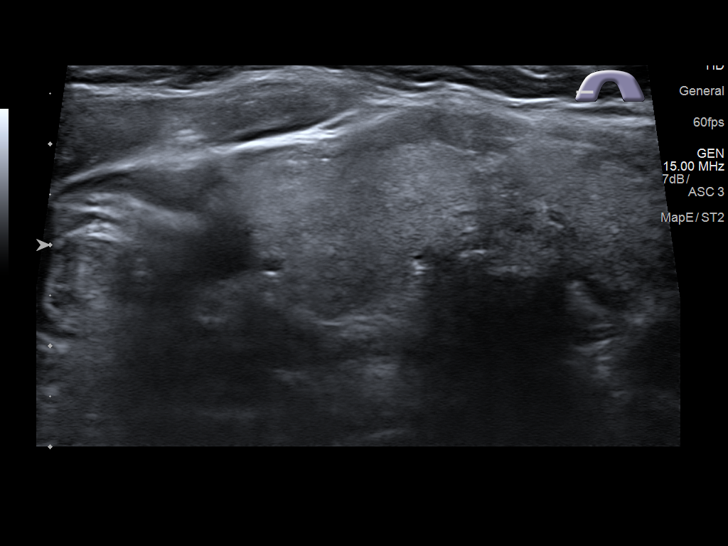
[im 24/58]
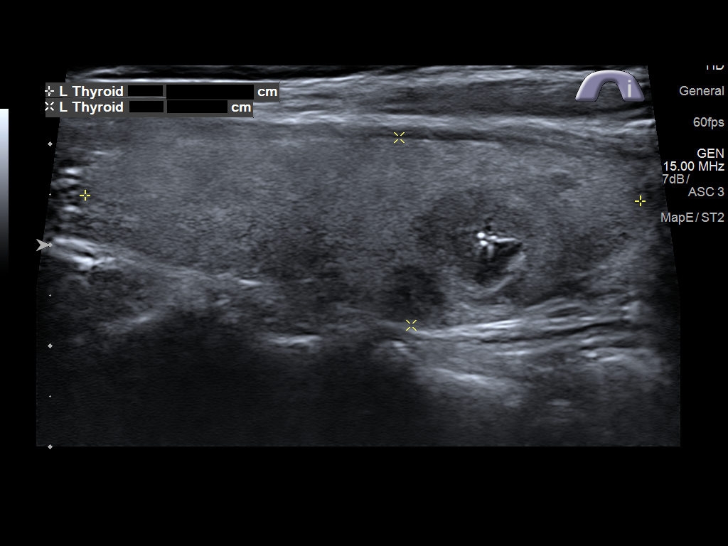
[im 29/58]
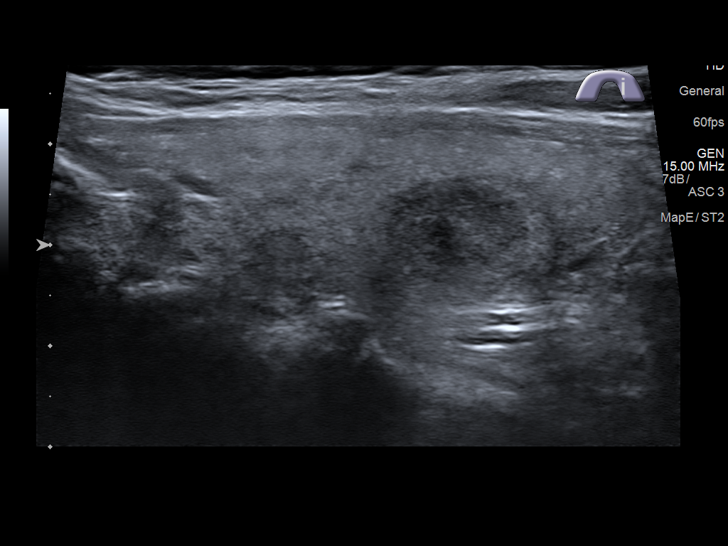
[im 34/58]
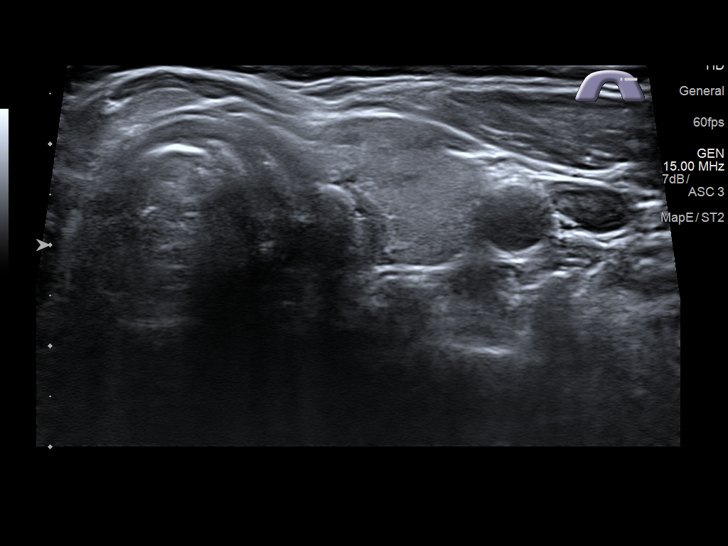
[im 39/58]
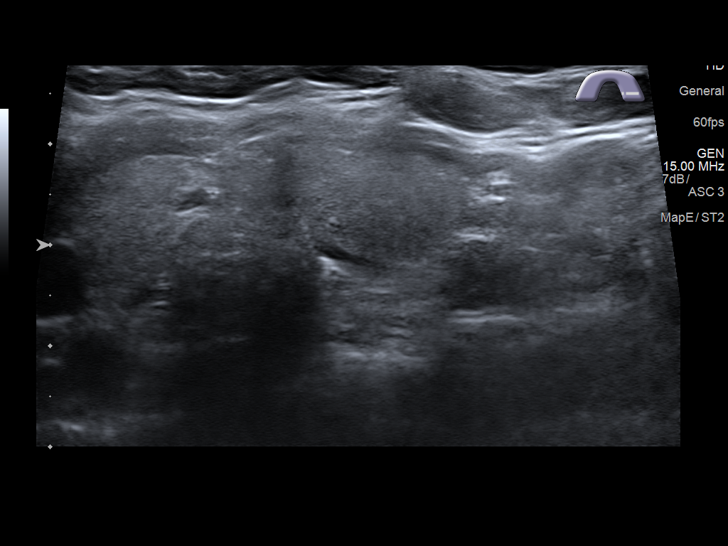
[im 43/58]
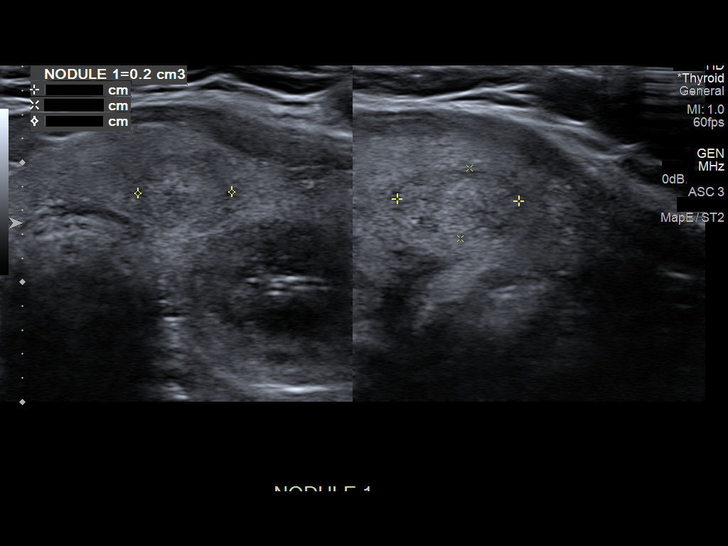
[im 48/58]
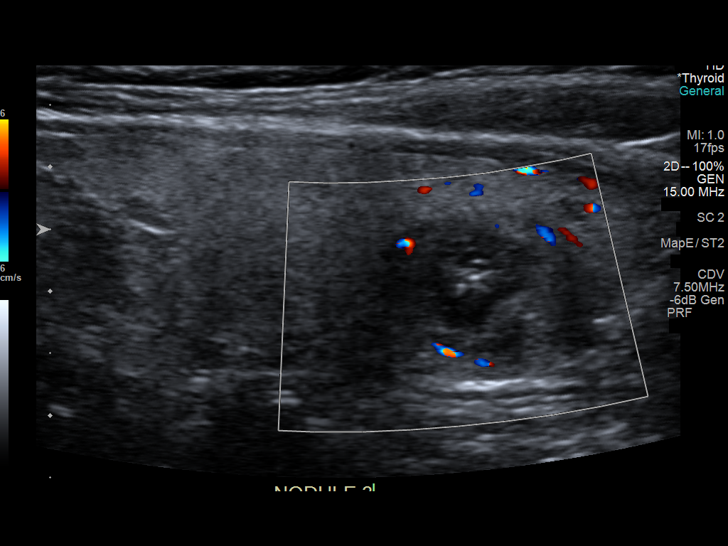
[im 53/58]
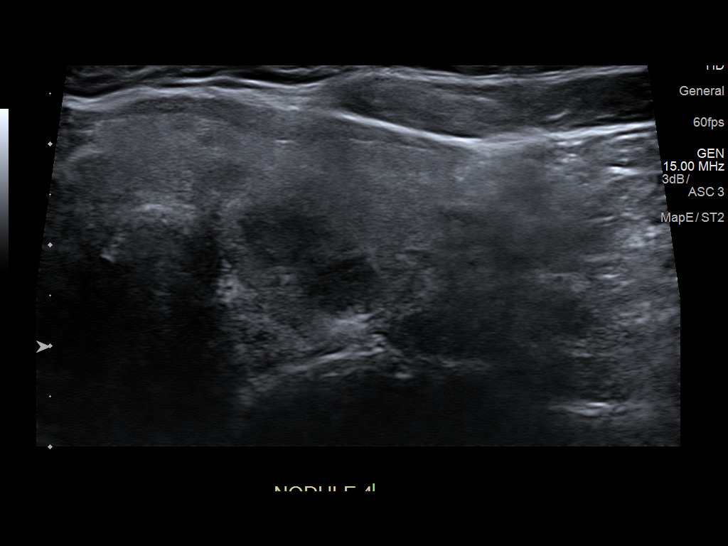
[im 58/58]
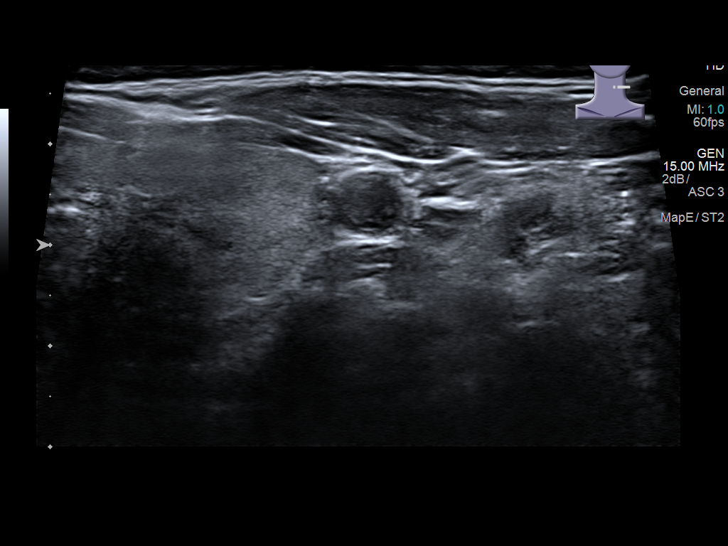

[13 of 25 positions shown; findings below may reference images not displayed]

FINDINGS: Parenchymal Echotexture: Mildly heterogenous

Isthmus: 6 mm

Right lobe: 4.7 x 2.1 x 2.0 cm

Left lobe: 5.5 x 2.4 x 1.9 cm

_________________________________________________________

Estimated total number of nodules >/= 1 cm: 2

Number of spongiform nodules >/=  2 cm not described below (TR1): 0

Number of mixed cystic and solid nodules >/= 1.5 cm not described
below (TR2): 0

_________________________________________________________

Nodule # 1:

Location: Left; Mid

Maximum size: 1.0 cm; Other 2 dimensions: 0.8 x 0.6 cm

Composition: solid/almost completely solid (2)

Echogenicity: isoechoic (1)

Shape: not taller-than-wide (0)

Margins: ill-defined (0)

Echogenic foci: none (0)

ACR TI-RADS total points: 3.

ACR TI-RADS risk category: TR3 (3 points).

ACR TI-RADS recommendations:

Given size (<1.4 cm) and appearance, this nodule does NOT meet
TI-RADS criteria for biopsy or dedicated follow-up.

_________________________________________________________

Nodule # 3:

Location: Left; Inferior

Maximum size: 1.6 cm; Other 2 dimensions: 1.5 x 1.4 cm

Composition: solid/almost completely solid (2)

Echogenicity: hypoechoic (2)

Shape: not taller-than-wide (0)

Margins: ill-defined (0)

Echogenic foci: macrocalcifications (1)

ACR TI-RADS total points: 5.

ACR TI-RADS risk category: TR5 (>/= 7 points).

ACR TI-RADS recommendations:

**Given size (>/= 1.0 cm) and appearance, fine needle aspiration of
this highly suspicious nodule should be considered based on TI-RADS
criteria.

_________________________________________________________

Additional bilateral subcentimeter mixed echogenicity nodules noted
bilaterally all measuring 8 mm or less in size. These are not fully
described by TI RADS criteria.
IMPRESSION: 1.0 cm left midpole TR 3 nodule does not meet criteria for biopsy or
follow-up

1.6 cm left inferior TR 5 nodule meets criteria for biopsy as above.
This nodule correlates with the CT finding.

The above is in keeping with the ACR TI-RADS recommendations - [HOSPITAL] 9338;[DATE].

## 2020-01-16 ENCOUNTER — Encounter: Payer: Self-pay | Admitting: Family Medicine

## 2020-08-28 ENCOUNTER — Telehealth: Payer: Self-pay | Admitting: Family Medicine

## 2020-08-28 NOTE — Telephone Encounter (Signed)
Copied from [place] [id number]. Topic: Medicare AWV >> Aug 28, 2020 11:28 AM Cher Nakai R wrote: Reason for CRM:  No answer unable to leave a  message for patient to call back and schedule the Medicare Annual Wellness Visit (AWV) virtually or by telephone.  Last AWV  06/27/2019  Please schedule at anytime with [place].  40 minute appointment  Any questions, please call me at [phone]

## 2021-02-25 DIAGNOSIS — M25512 Pain in left shoulder: Secondary | ICD-10-CM | POA: Diagnosis not present

## 2021-02-25 DIAGNOSIS — M7592 Shoulder lesion, unspecified, left shoulder: Secondary | ICD-10-CM | POA: Diagnosis not present

## 2021-03-23 DIAGNOSIS — M25512 Pain in left shoulder: Secondary | ICD-10-CM | POA: Diagnosis not present

## 2021-04-08 DIAGNOSIS — M25512 Pain in left shoulder: Secondary | ICD-10-CM | POA: Diagnosis not present

## 2021-04-08 DIAGNOSIS — M7592 Shoulder lesion, unspecified, left shoulder: Secondary | ICD-10-CM | POA: Diagnosis not present

## 2021-06-10 DIAGNOSIS — M7592 Shoulder lesion, unspecified, left shoulder: Secondary | ICD-10-CM | POA: Diagnosis not present

## 2021-06-11 DIAGNOSIS — S43432A Superior glenoid labrum lesion of left shoulder, initial encounter: Secondary | ICD-10-CM | POA: Diagnosis not present

## 2021-06-11 DIAGNOSIS — M7592 Shoulder lesion, unspecified, left shoulder: Secondary | ICD-10-CM | POA: Diagnosis not present

## 2021-06-11 DIAGNOSIS — M778 Other enthesopathies, not elsewhere classified: Secondary | ICD-10-CM | POA: Diagnosis not present

## 2021-06-11 DIAGNOSIS — M67814 Other specified disorders of tendon, left shoulder: Secondary | ICD-10-CM | POA: Diagnosis not present

## 2021-06-11 DIAGNOSIS — M19012 Primary osteoarthritis, left shoulder: Secondary | ICD-10-CM | POA: Diagnosis not present

## 2021-07-01 DIAGNOSIS — M7502 Adhesive capsulitis of left shoulder: Secondary | ICD-10-CM | POA: Diagnosis not present

## 2021-08-12 DIAGNOSIS — M7502 Adhesive capsulitis of left shoulder: Secondary | ICD-10-CM | POA: Diagnosis not present

## 2021-08-12 DIAGNOSIS — M25512 Pain in left shoulder: Secondary | ICD-10-CM | POA: Diagnosis not present

## 2022-03-03 DIAGNOSIS — H2513 Age-related nuclear cataract, bilateral: Secondary | ICD-10-CM | POA: Diagnosis not present

## 2022-03-09 DIAGNOSIS — H2511 Age-related nuclear cataract, right eye: Secondary | ICD-10-CM | POA: Diagnosis not present

## 2022-03-09 DIAGNOSIS — H2512 Age-related nuclear cataract, left eye: Secondary | ICD-10-CM | POA: Diagnosis not present

## 2022-03-26 DIAGNOSIS — H269 Unspecified cataract: Secondary | ICD-10-CM | POA: Diagnosis not present

## 2022-03-26 DIAGNOSIS — H2511 Age-related nuclear cataract, right eye: Secondary | ICD-10-CM | POA: Diagnosis not present

## 2022-04-09 DIAGNOSIS — H2512 Age-related nuclear cataract, left eye: Secondary | ICD-10-CM | POA: Diagnosis not present

## 2022-04-09 DIAGNOSIS — H269 Unspecified cataract: Secondary | ICD-10-CM | POA: Diagnosis not present
